# Patient Record
Sex: Male | Born: 2000 | Race: White | Hispanic: No | Marital: Single | Smoking: Never smoker
Health system: Southern US, Community
[De-identification: ages and names within clinical notes are randomized; demographics above are authoritative.]

## PROBLEM LIST (undated history)

## (undated) ENCOUNTER — Encounter

## (undated) ENCOUNTER — Ambulatory Visit

## (undated) DIAGNOSIS — K529 Noninfective gastroenteritis and colitis, unspecified: Secondary | ICD-10-CM

---
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
## 2021-02-09 ENCOUNTER — Other Ambulatory Visit: Payer: Self-pay

## 2021-02-09 ENCOUNTER — Ambulatory Visit
Admission: EM | Admit: 2021-02-09 | Discharge: 2021-02-09 | Disposition: A | Discharge status: Home / Self Care | Payer: Commercial Managed Care - PPO | Attending: Emergency Medicine | Admitting: Emergency Medicine

## 2021-02-09 ENCOUNTER — Encounter: Payer: Self-pay | Admitting: Emergency Medicine

## 2021-02-09 DIAGNOSIS — R0981 Nasal congestion: Secondary | ICD-10-CM

## 2021-02-09 DIAGNOSIS — J019 Acute sinusitis, unspecified: Secondary | ICD-10-CM

## 2021-02-09 MED ORDER — AMOXICILLIN-POT CLAVULANATE 875-125 MG PO TABS: 1.0000 | ORAL_TABLET | Freq: Two times a day (BID) | ORAL | 0 refills | Status: AC

## 2021-02-09 NOTE — Discharge Instructions (Signed)
COVID testing ordered.  It will take between 5-7 days for test results.  Someone will contact you regarding abnormal results.    In the meantime: You should remain isolated in your home for 5 days from symptom onset AND greater than 72 hours after symptoms resolution (absence of fever without the use of fever-reducing medication and improvement in respiratory symptoms), whichever is longer Get plenty of rest and push fluids Augmentin for sinus infection Use OTC zyrtec for nasal congestion, runny nose, and/or sore throat Use OTC flonase for nasal congestion and runny nose Use medications daily for symptom relief Use OTC medications like ibuprofen or tylenol as needed fever or pain Call or go to the ED if you have any new or worsening symptoms such as fever, cough, shortness of breath, chest tightness, chest pain, turning blue, changes in mental status, etc..Marland Kitchen

## 2021-02-09 NOTE — ED Triage Notes (Signed)
PT here with sinus pressure and nasal congestion x 3 weeks. C/O headache and sore throat as well. No relief from OTC meds.

## 2021-02-09 NOTE — ED Provider Notes (Signed)
[facility]   [id number] 02/09/21 Arrival Time: 1609   CC: COVID symptoms  SUBJECTIVE: History from: patient.  Lebert H Bidinger is a 20 y.o. male who presents with sinus pain, pressure, congestion, PND x 2-3 weeks.  Father with sinus infection one month ago.  Denies alleviating or aggravating factors.  Reports previous symptoms in the past.   Denies fever, chills, fatigue, sinus pain, rhinorrhea, sore throat, SOB, wheezing, chest pain, nausea, changes in bowel or bladder habits.     ROS: As per HPI.  All other pertinent ROS negative.     History reviewed. No pertinent past medical history. History reviewed. No pertinent surgical history. No Known Allergies No current facility-administered medications on file prior to encounter.   Current Outpatient Medications on File Prior to Encounter  Medication Sig Dispense Refill   albuterol (PROVENTIL HFA;VENTOLIN HFA) 108 (90 BASE) MCG/ACT inhaler Inhale 1-2 puffs into the lungs as needed for wheezing or shortness of breath (as needed before exercise). 2 Inhaler 2   Fexofenadine HCl (ALLEGRA PO) Take by mouth.     Social History   Socioeconomic History   Marital status: Single    Spouse name: Not on file   Number of children: Not on file   Years of education: Not on file   Highest education level: Not on file  Occupational History   Not on file  Tobacco Use   Smoking status: Never   Smokeless tobacco: Never  Substance and Sexual Activity   Alcohol use: Never   Drug use: Never   Sexual activity: Not on file  Other Topics Concern   Not on file  Social History Narrative   Not on file   Social Determinants of Health   Financial Resource Strain: Not on file  Food Insecurity: Not on file  Transportation Needs: Not on file  Physical Activity: Not on file  Stress: Not on file  Social Connections: Not on file  Intimate Partner Violence: Not on file   Family History  Problem Relation Age of Onset   Heart attack Father      OBJECTIVE:  Vitals:   02/09/21 1620 02/09/21 1621  BP: 126/77   Pulse: 85   Resp: 18   Temp: 98.4 F (36.9 C)   TempSrc: Oral   SpO2: 97%   Weight:  135 lb (61.2 kg)  Height:  5\' 7"  (1.702 m)    General appearance: alert; well-appearing, nontoxic; speaking in full sentences and tolerating own secretions HEENT: NCAT; Ears: EACs clear, TMs pearly gray; Eyes: PERRL.  EOM grossly intact.Nose: nares patent without rhinorrhea, Throat: oropharynx clear, tonsils non erythematous or enlarged, uvula midline  Neck: supple without LAD Lungs: unlabored respirations, symmetrical air entry; cough: absent; no respiratory distress; CTAB Heart: regular rate and rhythm.  Skin: warm and dry Psychological: alert and cooperative; normal mood and affect   ASSESSMENT & PLAN:  1. Sinus congestion   2. Acute non-recurrent sinusitis, unspecified location     Meds ordered this encounter  Medications   amoxicillin-clavulanate (AUGMENTIN) 875-125 MG tablet    Sig: Take 1 tablet by mouth every 12 (twelve) hours for 10 days.    Dispense:  20 tablet    Refill:  0    Order Specific Question:   Supervising Provider    Answer:   Eustace Moore    COVID testing ordered.  It will take between 5-7 days for test results.  Someone will contact you regarding abnormal results.    In  the meantime: You should remain isolated in your home for 5 days from symptom onset AND greater than 72 hours after symptoms resolution (absence of fever without the use of fever-reducing medication and improvement in respiratory symptoms), whichever is longer Get plenty of rest and push fluids Augmentin for sinus infection Use OTC zyrtec for nasal congestion, runny nose, and/or sore throat Use OTC flonase for nasal congestion and runny nose Use medications daily for symptom relief Use OTC medications like ibuprofen or tylenol as needed fever or pain Call or go to the ED if you have any new or worsening symptoms  such as fever, cough, shortness of breath, chest tightness, chest pain, turning blue, changes in mental status, etc...   Reviewed expectations re: course of current medical issues. Questions answered. Outlined signs and symptoms indicating need for more acute intervention. Patient verbalized understanding. After Visit Summary given.          Rennis Harding, PA-C 02/09/21 1642

## 2021-02-10 LAB — COVID-19, FLU A+B NAA
Influenza A, NAA: NOT DETECTED
Influenza B, NAA: NOT DETECTED
SARS-CoV-2, NAA: DETECTED — AB

## 2021-07-06 ENCOUNTER — Encounter (HOSPITAL_COMMUNITY): Payer: Self-pay | Admitting: *Deleted

## 2021-07-06 ENCOUNTER — Encounter: Payer: Self-pay | Admitting: Emergency Medicine

## 2021-07-06 ENCOUNTER — Emergency Department (HOSPITAL_COMMUNITY): Payer: Commercial Managed Care - PPO

## 2021-07-06 ENCOUNTER — Other Ambulatory Visit: Payer: Self-pay

## 2021-07-06 ENCOUNTER — Ambulatory Visit
Admission: EM | Admit: 2021-07-06 | Discharge: 2021-07-06 | Disposition: A | Discharge status: Home / Self Care | Payer: Commercial Managed Care - PPO | Attending: Emergency Medicine | Admitting: Emergency Medicine

## 2021-07-06 ENCOUNTER — Emergency Department (HOSPITAL_COMMUNITY)
Admission: EM | Admit: 2021-07-06 | Discharge: 2021-07-06 | Disposition: A | Discharge status: Home / Self Care | Payer: Commercial Managed Care - PPO | Attending: Emergency Medicine | Admitting: Emergency Medicine

## 2021-07-06 DIAGNOSIS — R197 Diarrhea, unspecified: Secondary | ICD-10-CM

## 2021-07-06 DIAGNOSIS — K529 Noninfective gastroenteritis and colitis, unspecified: Secondary | ICD-10-CM | POA: Insufficient documentation

## 2021-07-06 DIAGNOSIS — R1084 Generalized abdominal pain: Secondary | ICD-10-CM | POA: Diagnosis not present

## 2021-07-06 DIAGNOSIS — R109 Unspecified abdominal pain: Secondary | ICD-10-CM | POA: Diagnosis present

## 2021-07-06 LAB — URINALYSIS, ROUTINE W REFLEX MICROSCOPIC
Bilirubin Urine: NEGATIVE
Glucose, UA: NEGATIVE mg/dL
Hgb urine dipstick: NEGATIVE
Ketones, ur: NEGATIVE mg/dL
Leukocytes,Ua: NEGATIVE
Nitrite: NEGATIVE
Protein, ur: NEGATIVE mg/dL
Specific Gravity, Urine: 1.01 (ref 1.005–1.030)
pH: 6.5 (ref 5.0–8.0)

## 2021-07-06 LAB — CBC
HCT: 44.6 % (ref 39.0–52.0)
Hemoglobin: 15 g/dL (ref 13.0–17.0)
MCH: 31.7 pg (ref 26.0–34.0)
MCHC: 33.6 g/dL (ref 30.0–36.0)
MCV: 94.3 fL (ref 80.0–100.0)
Platelets: 179 10*3/uL (ref 150–400)
RBC: 4.73 MIL/uL (ref 4.22–5.81)
RDW: 13.7 % (ref 11.5–15.5)
WBC: 4.3 10*3/uL (ref 4.0–10.5)
nRBC: 0 % (ref 0.0–0.2)

## 2021-07-06 LAB — COMPREHENSIVE METABOLIC PANEL
ALT: 27 U/L (ref 0–44)
AST: 34 U/L (ref 15–41)
Albumin: 3.8 g/dL (ref 3.5–5.0)
Alkaline Phosphatase: 62 U/L (ref 38–126)
Anion gap: 8 (ref 5–15)
BUN: 14 mg/dL (ref 6–20)
CO2: 28 mmol/L (ref 22–32)
Calcium: 8.6 mg/dL — ABNORMAL LOW (ref 8.9–10.3)
Chloride: 102 mmol/L (ref 98–111)
Creatinine, Ser: 0.95 mg/dL (ref 0.61–1.24)
GFR, Estimated: >60 mL/min (ref >60)
Glucose, Bld: 153 mg/dL — ABNORMAL HIGH (ref 70–99)
Potassium: 3.8 mmol/L (ref 3.5–5.1)
Sodium: 138 mmol/L (ref 135–145)
Total Bilirubin: 0.2 mg/dL — ABNORMAL LOW (ref 0.3–1.2)
Total Protein: 7 g/dL (ref 6.5–8.1)

## 2021-07-06 LAB — LIPASE, BLOOD: Lipase: 35 U/L (ref 11–51)

## 2021-07-06 MED ORDER — IOHEXOL 300 MG/ML  SOLN
100.0000 mL | Freq: Once | INTRAMUSCULAR | Status: AC | PRN
  Administered 2021-07-06: 100 mL via INTRAVENOUS

## 2021-07-06 MED ORDER — METRONIDAZOLE 500 MG PO TABS: 500.0000 mg | ORAL_TABLET | Freq: Two times a day (BID) | ORAL | 0 refills | Status: AC

## 2021-07-06 MED ORDER — CIPROFLOXACIN HCL 500 MG PO TABS: 500.0000 mg | ORAL_TABLET | Freq: Two times a day (BID) | ORAL | 0 refills | Status: AC

## 2021-07-06 NOTE — ED Provider Notes (Signed)
HPI  SUBJECTIVE:  Matthew Salas is a 20 y.o. male who presents with 1 week of diffuse abdominal pain/discomfort present before stooling.  It lasts minutes until he uses the bathroom.  He states that he had normal stooling pattern, but it was looser than usual up until yesterday.  He reports over 12 episodes of always bloody diarrhea/bowel movements starting yesterday.  He describes it as burgundy/maroon.  He denies bright red blood per rectum, melena.  The abdominal pain has not changed.  No nausea, vomiting, fevers, abdominal distention, anorexia.  Reports lightheadedness this morning.  No chest pain, shortness of breath, dizziness, syncope.  No recent antibiotics, travel, raw or undercooked foods, questionable leftovers, contacts with similar symptoms.  He states that he "drinks a lot", 4-6 beers per day, states that he has been slowing down for the past 3 to 4 days.  He has never had a colonoscopy.  He has never had symptoms like this before.  No history of abdominal surgeries, anticoagulant/antiplatelet use.  PMD:Pcp, No   History reviewed. No pertinent past medical history.  History reviewed. No pertinent surgical history.  Family History  Problem Relation Age of Onset   Heart attack Father     Social History   Tobacco Use   Smoking status: Never   Smokeless tobacco: Never  Substance Use Topics   Alcohol use: Never   Drug use: Never    No current facility-administered medications for this encounter.  Current Outpatient Medications:    albuterol (PROVENTIL HFA;VENTOLIN HFA) 108 (90 BASE) MCG/ACT inhaler, Inhale 1-2 puffs into the lungs as needed for wheezing or shortness of breath (as needed before exercise)., Disp: 2 Inhaler, Rfl: 2   Fexofenadine HCl (ALLEGRA PO), Take by mouth., Disp: , Rfl:   No Known Allergies   ROS  As noted in HPI.   Physical Exam  BP 124/77 (BP Location: Right Arm)   Pulse 74   Temp 98.6 F (37 C) (Oral)   Resp 16   SpO2 99%    Constitutional: Well developed, well nourished, no acute distress Eyes:  EOMI, conjunctiva normal bilaterally HENT: Normocephalic, atraumatic,mucus membranes moist Respiratory: Normal inspiratory effort Cardiovascular: Normal rate GI: nondistended soft, nontender, nondistended.  Active bowel sounds.  No rebound, guarding Rectal: Deferred per patient request skin: No rash, skin intact Musculoskeletal: no deformities Neurologic: Alert & oriented x 3, no focal neuro deficits Psychiatric: Speech and behavior appropriate   ED Course   Medications - No data to display  No orders of the defined types were placed in this encounter.   No results found for this or any previous visit (from the past 24 hour(s)). No results found.  ED Clinical Impression  1. Bloody diarrhea   2. Generalized abdominal pain      ED Assessment/Plan  Patient reporting over 12 episodes of bloody, dark, diarrhea/stools starting yesterday.  Concern for lower GI bleed, severe infection, colitis.  Transferring to the emergency department for labs and further evaluation.  Patient is stable to go by private vehicle.  Discussed rationale for transfer to the emergency department with patient.  He agrees to go.  No orders of the defined types were placed in this encounter.   *This clinic note was created using Dragon dictation software. Therefore, there may be occasional mistakes despite careful proofreading.  ?    Domenick Gong, MD 07/06/21 1227

## 2021-07-06 NOTE — ED Provider Notes (Signed)
[facility] EMERGENCY DEPARTMENT Provider Note   CSN: [id number] Arrival date & time: 07/06/21  1304     History Chief Complaint  Patient presents with   Abdominal Pain    Matthew Salas is a 20 y.o. male.  The history is provided by the patient. No language interpreter was used.  Rectal Bleeding Quality:  Bright red Amount:  Moderate Duration:  2 days Timing:  Constant Chronicity:  New Context: not constipation and not hemorrhoids   Similar prior episodes: no   Relieved by:  Nothing Worsened by:  Nothing Ineffective treatments:  None tried Associated symptoms: abdominal pain   Risk factors: no anticoagulant use       History reviewed. No pertinent past medical history.  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 No pertinent surgical history.     Family History  Problem Relation Age of Onset   Heart attack Father     Social History   Tobacco Use   Smoking status: Never   Smokeless tobacco: Never  Substance Use Topics   Alcohol use: Never   Drug use: Never    Home Medications Prior to Admission medications   Medication Sig Start Date End Date Taking? Authorizing Provider  ciprofloxacin (CIPRO) 500 MG tablet Take 1 tablet (500 mg total) by mouth 2 (two) times daily for 10 days. 07/06/21 07/16/21 Yes Caryl Ada K, PA-C  metroNIDAZOLE (FLAGYL) 500 MG tablet Take 1 tablet (500 mg total) by mouth 2 (two) times daily for 10 days. 07/06/21 07/16/21 Yes Fransico Meadow, PA-C  albuterol (PROVENTIL HFA;VENTOLIN HFA) 108 (90 BASE) MCG/ACT inhaler Inhale 1-2 puffs into the lungs as needed for wheezing or shortness of breath (as needed before exercise). Patient not taking: Reported on 07/06/2021 10/21/13   Leeanne [place], MD   brompheniramine-pseudoephedrine-DM 30-2-10 MG/5ML syrup Take by mouth. Patient not taking: Reported on 07/06/2021 04/11/21   [provider]  Fexofenadine HCl (ALLEGRA PO) Take by mouth. Patient not taking: Reported on 07/06/2021    [provider]  lidocaine (XYLOCAINE) 2 % solution SMARTSIG:10 Milliliter(s) By Mouth Every 3 Hours PRN Patient not taking: Reported on 07/06/2021 04/11/21   [provider]    Allergies    Patient has no known allergies.  Review of Systems   Review of Systems  Gastrointestinal:  Positive for abdominal pain, blood in stool and hematochezia.  All other systems reviewed and are negative.  Physical Exam Updated Vital Signs BP 116/72 (BP Location: Left Arm)   Pulse 85   Temp 98.2 F (36.8 C) (Oral)   Resp 16   SpO2 100%   Physical Exam Vitals and nursing note reviewed.  Constitutional:      General: He is not in acute distress.    Appearance: He is well-developed.  HENT:     Head: Normocephalic and atraumatic.  Eyes:     Conjunctiva/sclera: Conjunctivae normal.  Cardiovascular:     Rate and Rhythm: Normal rate and regular rhythm.     Heart sounds: No murmur heard. Pulmonary:     Effort: Pulmonary effort is normal. No respiratory distress.     Breath sounds: Normal breath sounds.  Abdominal:     General: Abdomen is flat.     Palpations: Abdomen is  soft.     Tenderness: There is no abdominal tenderness.  Musculoskeletal:        General: No swelling.     Cervical back: Neck supple.  Skin:    General: Skin is warm and dry.     Capillary Refill: Capillary refill takes less than 2 seconds.  Neurological:     Mental Status: He is alert.  Psychiatric:        Mood and Affect: Mood normal.    ED Results / Procedures / Treatments   Labs (all labs ordered are listed, but only abnormal results are displayed) Labs Reviewed  COMPREHENSIVE METABOLIC PANEL - Abnormal; Notable for the following components:      Result Value    Glucose, Bld 153 (*)    Calcium 8.6 (*)    Total Bilirubin 0.2 (*)    All other components within normal limits  LIPASE, BLOOD  CBC  URINALYSIS, ROUTINE W REFLEX MICROSCOPIC    EKG None  Radiology CT ABDOMEN PELVIS W CONTRAST  Addendum Date: 07/06/2021   ADDENDUM REPORT: 07/06/2021 19:48 ADDENDUM: Dictation error involving the impression section of the report. The impression should state: Mild pancolonic and terminal ileal INFLAMMATION without evidence of perforation or walled off fluid collections. Findings are suggestive of infectious or inflammatory enterocolitis. Electronically Signed   By: Maudry Mayhew M.D.   On: 07/06/2021 19:48   Result Date: 07/06/2021 CLINICAL DATA:  One week of abdominal pain and diarrhea. EXAM: CT ABDOMEN AND PELVIS WITH CONTRAST TECHNIQUE: Multidetector CT imaging of the abdomen and pelvis was performed using the standard protocol following bolus administration of intravenous contrast. CONTRAST:  OMNIPAQUE IOHEXOL 300 MG/ML  SOLN COMPARISON:  None. FINDINGS: Lower chest: No acute abnormality. Hepatobiliary: No suspicious hepatic lesion. Gallbladder is decompressed. No biliary ductal dilation. Pancreas: No pancreatic ductal dilation or evidence of acute inflammation. Spleen: Within normal limits. Adrenals/Urinary Tract: Adrenal glands are unremarkable. Kidneys are normal, without renal calculi, focal lesion, or hydronephrosis. Bladder is unremarkable. Stomach/Bowel: No enteric contrast was administered. Stomach is unremarkable for degree of distension. No pathologic dilation of small or large bowel. Normal appendix. Inflammation of the terminal ileum. Mild pancolonic inflammation. No pneumatosis. Vascular/Lymphatic: No significant vascular findings are present. No pathologically enlarged abdominal or pelvic lymph nodes. Reproductive: Prostate is unremarkable. Other: No significant abdominopelvic free fluid. Musculoskeletal: No acute or significant osseous  findings. IMPRESSION: Mild pancolonic and terminal ileum trauma no evidence of perforation or walled off fluid collections. Findings are suggestive of infectious or inflammatory enterocolitis. Electronically Signed: By: Maudry Mayhew M.D. On: 07/06/2021 16:54    Procedures Procedures   Medications Ordered in ED Medications  iohexol (OMNIPAQUE) 300 MG/ML solution 100 mL (100 mLs Intravenous Contrast Given 07/06/21 1620)    ED Course  I have reviewed the triage vital signs and the nursing notes.  Pertinent labs & imaging results that were available during my care of the patient were reviewed by me and considered in my medical decision making (see chart for details).    MDM Rules/Calculators/A&P                           MDM:  Ct scan shows infectious/inflamatory changes.  Pt counseled on results. Pt given rx for cipro  Final Clinical Impression(s) / ED Diagnoses Final diagnoses:  Colitis    Rx / DC Orders ED Discharge Orders          Ordered    ciprofloxacin (CIPRO)  500 MG tablet  2 times daily        07/06/21 1933    metroNIDAZOLE (FLAGYL) 500 MG tablet  2 times daily        07/06/21 1933          An After Visit Summary was printed and given to the patient.    Fransico Meadow, PA-C 07/06/21 [place], [place], DO 07/09/21 [phone]

## 2021-07-06 NOTE — ED Notes (Signed)
Patient is being discharged from the Urgent Care and sent to the Emergency Department via POV . Per Konrad Saha, MD, patient is in need of higher level of care due to possible GI bleed. Patient is aware and verbalizes understanding of plan of care.  Vitals:   07/06/21 1141  BP: 124/77  Pulse: 74  Resp: 16  Temp: 98.6 F (37 C)  SpO2: 99%

## 2021-07-06 NOTE — Discharge Instructions (Addendum)
Return if any problems.  Schedule follow up with Gi.  See your Physician for recheck in 3-4 days

## 2021-07-06 NOTE — Discharge Instructions (Addendum)
I am concerned that you could have a lower GI bleed, colitis, or severe infection.  I Think we need more information to make sure that you are safe to go home.

## 2021-07-06 NOTE — ED Triage Notes (Signed)
Pt presents with abdominal pain and diarrhea xs 1 week. States the past two days has had bloody stools.

## 2021-07-06 NOTE — ED Triage Notes (Signed)
Abdominal pain with diarrhea for over a week, blood in stools for the past 2 days

## 2021-07-07 ENCOUNTER — Telehealth: Payer: Self-pay | Admitting: Internal Medicine

## 2021-07-07 NOTE — Telephone Encounter (Signed)
ER REFERRAL  

## 2021-08-01 ENCOUNTER — Encounter (HOSPITAL_COMMUNITY): Payer: Self-pay

## 2021-08-01 ENCOUNTER — Emergency Department (HOSPITAL_COMMUNITY): Payer: Commercial Managed Care - PPO

## 2021-08-01 ENCOUNTER — Emergency Department (HOSPITAL_COMMUNITY)
Admission: EM | Admit: 2021-08-01 | Discharge: 2021-08-02 | Disposition: A | Discharge status: Home / Self Care | Payer: Commercial Managed Care - PPO | Attending: Emergency Medicine | Admitting: Emergency Medicine

## 2021-08-01 ENCOUNTER — Other Ambulatory Visit: Payer: Self-pay

## 2021-08-01 DIAGNOSIS — R1032 Left lower quadrant pain: Secondary | ICD-10-CM | POA: Diagnosis not present

## 2021-08-01 DIAGNOSIS — R109 Unspecified abdominal pain: Secondary | ICD-10-CM | POA: Diagnosis present

## 2021-08-01 DIAGNOSIS — K529 Noninfective gastroenteritis and colitis, unspecified: Secondary | ICD-10-CM

## 2021-08-01 LAB — CBC
HCT: 44.9 % (ref 39.0–52.0)
Hemoglobin: 14.9 g/dL (ref 13.0–17.0)
MCH: 30.5 pg (ref 26.0–34.0)
MCHC: 33.2 g/dL (ref 30.0–36.0)
MCV: 92 fL (ref 80.0–100.0)
Platelets: 286 10*3/uL (ref 150–400)
RBC: 4.88 MIL/uL (ref 4.22–5.81)
RDW: 13.4 % (ref 11.5–15.5)
WBC: 7.9 10*3/uL (ref 4.0–10.5)
nRBC: 0 % (ref 0.0–0.2)

## 2021-08-01 LAB — COMPREHENSIVE METABOLIC PANEL
ALT: 17 U/L (ref 0–44)
AST: 18 U/L (ref 15–41)
Albumin: 3.1 g/dL — ABNORMAL LOW (ref 3.5–5.0)
Alkaline Phosphatase: 66 U/L (ref 38–126)
Anion gap: 8 (ref 5–15)
BUN: 5 mg/dL — ABNORMAL LOW (ref 6–20)
CO2: 27 mmol/L (ref 22–32)
Calcium: 8.7 mg/dL — ABNORMAL LOW (ref 8.9–10.3)
Chloride: 103 mmol/L (ref 98–111)
Creatinine, Ser: 0.98 mg/dL (ref 0.61–1.24)
GFR, Estimated: >60 mL/min (ref >60)
Glucose, Bld: 107 mg/dL — ABNORMAL HIGH (ref 70–99)
Potassium: 3.6 mmol/L (ref 3.5–5.1)
Sodium: 138 mmol/L (ref 135–145)
Total Bilirubin: 0.5 mg/dL (ref 0.3–1.2)
Total Protein: 6.7 g/dL (ref 6.5–8.1)

## 2021-08-01 LAB — LIPASE, BLOOD: Lipase: 25 U/L (ref 11–51)

## 2021-08-01 MED ORDER — OXYCODONE-ACETAMINOPHEN 5-325 MG PO TABS
1.0000 | ORAL_TABLET | Freq: Once | ORAL | Status: AC
  Administered 2021-08-01: 1 via ORAL
  Filled 2021-08-01: qty 1

## 2021-08-01 MED ORDER — IOHEXOL 300 MG/ML  SOLN
80.0000 mL | Freq: Once | INTRAMUSCULAR | Status: AC | PRN
  Administered 2021-08-01: 80 mL via INTRAVENOUS

## 2021-08-01 MED ORDER — ONDANSETRON 4 MG PO TBDP
4.0000 mg | ORAL_TABLET | Freq: Once | ORAL | Status: AC
  Administered 2021-08-01: 4 mg via ORAL
  Filled 2021-08-01: qty 1

## 2021-08-01 NOTE — ED Triage Notes (Signed)
Patient complains of ongoing abdominal pain after being seen at [facility] ED. Has taken 2 antibiotics and continuing to have pain more left sided. Nausea and frequent bowel movements all day long. Reports stool is loose and bloody.

## 2021-08-01 NOTE — ED Provider Notes (Signed)
Emergency Medicine Provider Triage Evaluation Note  Matthew Salas , a 21 y.o. male  was evaluated in triage.  Pt complains of left lower quadrant abdominal pain and bleeding.  Seen previously had a CT scan at [facility] that showed colitis, treated with antibiotics with no improvement.  Continues have severe pain and bloody stools.  Has a follow-up appointment with [facility] on the 19th.  Review of Systems  Positive: Hematochezia, abdominal pain Negative: Vomited  Physical Exam  BP 126/90 (BP Location: Left Arm)    Pulse 86    Temp 98 F (36.7 C) (Oral)    Resp 18    SpO2 100%  Gen:   Awake, no distress   Resp:  Normal effort  MSK:   Moves extremities without difficulty  Other:  TTP LLQ  Medical Decision Making  Medically screening exam initiated at 3:22 PM.  Appropriate orders placed.  Jairen H Barbone was informed that the remainder of the evaluation will be completed by another provider, this initial triage assessment does not replace that evaluation, and the importance of remaining in the ED until their evaluation is complete.  Abd pain - discussed possibility of Crohn's  Work up started   Margarita Mail, PA-C [phone] [phone]    [place] P, [place] [phone]

## 2021-08-02 ENCOUNTER — Telehealth: Payer: Self-pay | Admitting: *Deleted

## 2021-08-02 ENCOUNTER — Encounter: Payer: Self-pay | Admitting: Nurse Practitioner

## 2021-08-02 MED ORDER — OXYCODONE HCL 5 MG PO TABS: 2.5000 mg | ORAL_TABLET | Freq: Once | ORAL | Status: DC

## 2021-08-02 MED ORDER — OXYCODONE HCL 5 MG PO TABS: 2.5000 mg | ORAL_TABLET | Freq: Four times a day (QID) | ORAL | 0 refills | Status: AC | PRN

## 2021-08-02 MED ORDER — AMOXICILLIN-POT CLAVULANATE 875-125 MG PO TABS
ORAL_TABLET | ORAL | Status: AC
  Filled 2021-08-02: qty 1

## 2021-08-02 MED ORDER — OXYCODONE HCL 5 MG PO TABS
5.0000 mg | ORAL_TABLET | Freq: Once | ORAL | Status: AC
  Administered 2021-08-02: 5 mg via ORAL

## 2021-08-02 MED ORDER — AMOXICILLIN-POT CLAVULANATE 875-125 MG PO TABS
1.0000 | ORAL_TABLET | Freq: Once | ORAL | Status: AC
  Administered 2021-08-02: 1 via ORAL

## 2021-08-02 MED ORDER — AMOXICILLIN-POT CLAVULANATE 875-125 MG PO TABS: 1.0000 | ORAL_TABLET | Freq: Two times a day (BID) | ORAL | 0 refills | Status: AC

## 2021-08-02 MED ORDER — OXYCODONE HCL 5 MG PO TABS
ORAL_TABLET | ORAL | Status: AC
  Filled 2021-08-02: qty 1

## 2021-08-02 NOTE — Discharge Instructions (Addendum)
For pain control you may take at 1000 mg of Tylenol every 8 hours scheduled.  In addition you can take 0.5 to 1 tablet of Oxycodone every 6 hours as needed for pain not controlled with the scheduled Tylenol. ? ?

## 2021-08-02 NOTE — ED Provider Notes (Signed)
[facility] EMERGENCY DEPARTMENT Provider Note  CSN: [id number] Arrival date & time: 08/01/21 1407  Chief Complaint(s) No chief complaint on file.  HPI Matthew Salas is a 21 y.o. male recently treated for colitis with Cipro and Flagyl in early December here with persistent and gradually worsening abdominal pain.  Pain now more localized to the left abdomen described as cramping and burning sensation.  Patient is still having multiple bloody bowel movements throughout the day.  No fevers or chills.  No nausea or vomiting.  Patient has a follow-up appointment with gastroenterology on January 19.    HPI  Past Medical History History reviewed. No pertinent past medical history. 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 Authorizing Provider  albuterol (PROVENTIL HFA;VENTOLIN HFA) 108 (90 BASE) MCG/ACT inhaler Inhale 1-2 puffs into the lungs as needed for wheezing or shortness of breath (as needed before exercise). Patient not taking: Reported on 07/06/2021 10/21/13   Leeanne Rio, MD  brompheniramine-pseudoephedrine-DM 30-2-10 MG/5ML syrup Take by mouth. Patient not taking: Reported on 07/06/2021 04/11/21   [provider]  Fexofenadine HCl (ALLEGRA PO) Take by mouth. Patient not taking: Reported on 07/06/2021    [provider]  lidocaine (XYLOCAINE) 2 % solution SMARTSIG:10 Milliliter(s) By Mouth Every 3 Hours PRN Patient not taking: Reported on 07/06/2021 04/11/21   [provider]                                                                                                                                    Allergies Patient has no  known allergies.  Review of Systems Review of Systems As noted in HPI  Physical Exam Vital Signs  I have reviewed the triage vital signs BP 112/70    Pulse 76    Temp 98 F (36.7 C) (Oral)    Resp 18    SpO2 99%   Physical Exam Vitals reviewed.  Constitutional:      General: He is not in acute distress.    Appearance: He is well-developed. He is not diaphoretic.  HENT:     Head: Normocephalic and atraumatic.     Right Ear: External ear normal.     Left Ear: External ear normal.     Nose: Nose normal.     Mouth/Throat:     Mouth: Mucous membranes are moist.  Eyes:     General: No scleral icterus.    Conjunctiva/sclera: Conjunctivae normal.  Neck:     Trachea: Phonation normal.  Cardiovascular:     Rate and Rhythm: Normal rate and regular rhythm.  Pulmonary:  Effort: Pulmonary effort is normal. No respiratory distress.     Breath sounds: No stridor.  Abdominal:     General: There is no distension.     Tenderness: There is abdominal tenderness in the left lower quadrant. There is no guarding or rebound.  Musculoskeletal:        General: Normal range of motion.     Cervical back: Normal range of motion.  Neurological:     Mental Status: He is alert and oriented to person, place, and time.  Psychiatric:        Behavior: Behavior normal.    ED Results and Treatments Labs (all labs ordered are listed, but only abnormal results are displayed) Labs Reviewed  COMPREHENSIVE METABOLIC PANEL - Abnormal; Notable for the following components:      Result Value   Glucose, Bld 107 (*)    BUN 5 (*)    Calcium 8.7 (*)    Albumin 3.1 (*)    All other components within normal limits  GASTROINTESTINAL PANEL BY PCR, STOOL (REPLACES STOOL CULTURE)  C DIFFICILE QUICK SCREEN W PCR REFLEX    LIPASE, BLOOD  CBC  URINALYSIS, ROUTINE W REFLEX MICROSCOPIC                                                                                                                         EKG   EKG Interpretation  Date/Time:    Ventricular Rate:    PR Interval:    QRS Duration:   QT Interval:    QTC Calculation:   R Axis:     Text Interpretation:         Radiology CT ABDOMEN PELVIS W CONTRAST  Result Date: 08/01/2021 CLINICAL DATA:  Lower abdominal pain, bloody diarrhea. EXAM: CT ABDOMEN AND PELVIS WITH CONTRAST TECHNIQUE: Multidetector CT imaging of the abdomen and pelvis was performed using the standard protocol following bolus administration of intravenous contrast. CONTRAST:  64mL OMNIPAQUE IOHEXOL 300 MG/ML  SOLN COMPARISON:  July 06, 2021. FINDINGS: Lower chest: No acute abnormality. Hepatobiliary: No focal liver abnormality is seen. No gallstones, gallbladder wall thickening, or biliary dilatation. Pancreas: Unremarkable. No pancreatic ductal dilatation or surrounding inflammatory changes. Spleen: Normal in size without focal abnormality. Adrenals/Urinary Tract: Adrenal glands are unremarkable. Kidneys are normal, without renal calculi, focal lesion, or hydronephrosis. Bladder is unremarkable. Stomach/Bowel: The stomach and appendix are unremarkable. There is no evidence of bowel obstruction. Wall thickening of descending and sigmoid colon is noted concerning for infectious or inflammatory colitis. Vascular/Lymphatic: No significant vascular findings are present. No enlarged abdominal or pelvic lymph nodes. Reproductive: Prostate is unremarkable. Other: No abdominal wall hernia or abnormality. No abdominopelvic ascites. Musculoskeletal: No acute or significant osseous findings. IMPRESSION: Wall thickening of descending and sigmoid colon consistent with infectious or inflammatory colitis. Electronically Signed   By: Marijo Conception M.D.   On: 08/01/2021 16:25    Pertinent labs & imaging results that were available during my care of the patient were  reviewed by me and considered in my medical decision making (see MDM for details).  Medications Ordered in ED Medications   oxyCODONE-acetaminophen (PERCOCET/ROXICET) 5-325 MG per tablet 1 tablet (1 tablet Oral Given 08/01/21 1529)  ondansetron (ZOFRAN-ODT) disintegrating tablet 4 mg (4 mg Oral Given 08/01/21 1530)  iohexol (OMNIPAQUE) 300 MG/ML solution 80 mL (80 mLs Intravenous Contrast Given 08/01/21 1612)  amoxicillin-clavulanate (AUGMENTIN) 875-125 MG per tablet 1 tablet (1 tablet Oral Given 08/02/21 0417)  oxyCODONE (Oxy IR/ROXICODONE) immediate release tablet 5 mg (5 mg Oral Given 08/02/21 0417)                                                                                                                                     Procedures Procedures  (including critical care time)  Medical Decision Making / ED Course     Patient here with persistent abdominal pain and hematochezia. Previously treated with Cipro Flagyl for colitis.  Seen in the MSE process and appropriate labs and imaging obtained.  My interpretation of the labs and imaging noted below: CBC without leukocytosis or anemia. Metabolic panel without evidence of electrolyte derangements or renal sufficiency.  BUN within normal limits. CT scan does show some wall thickening in the descending and sigmoid colon.  I do not appreciate evidence of perforation.  This was confirmed by radiology.  The CT scan from early December showed pancolitis including the distal ileum.   Attempted to obtain stool sample during her stay but went to the bathroom prior to getting a collection.   Given the lack of significant metabolic derangements, obstruction, significant hemorrhage or peritonitis, the patient does not require admission to the hospital.  He is appropriate for outpatient management at this time.  Patient was treated symptomatically with oral pain medicine. Given the improvement of the patient's inflammation, will treat with additional antibiotics for likely infectious source. Patient will need to follow-up with gastroenterology to rule out IBD. We will  hold off on steroids at this time.   Patient and family requested information for local gastroenterologist.    Final Clinical Impression(s) / ED Diagnoses Final diagnoses:  None   The patient appears reasonably screened and/or stabilized for discharge and I doubt any other medical condition or other [facility] requiring further screening, evaluation, or treatment in the ED at this time prior to discharge. Safe for discharge with strict return precautions.  Disposition: Discharge  Condition: Good  I have discussed the results, Dx and Tx plan with the patient/family who expressed understanding and agree(s) with the plan. Discharge instructions discussed at length. The patient/family was given strict return precautions who verbalized understanding of the instructions. No further questions at time of discharge.    ED Discharge Orders          Ordered    amoxicillin-clavulanate (AUGMENTIN) 875-125 MG tablet  Every 12 hours        08/02/21 0439    oxyCODONE (ROXICODONE)  5 MG immediate release tablet  Every 6 hours PRN        08/02/21 [place] narcotic database reviewed and no active prescriptions noted.   Follow Up: [facility] [address] [place] [phone] Call  to schedule an appointment for close follow up  [facility] [address] [place] [phone] Call  to schedule an appointment for close follow up           This chart was dictated using voice recognition software.  Despite best efforts to proofread,  errors can occur which can change the documentation meaning.    Fatima Blank, MD 08/02/21 [phone]

## 2021-08-02 NOTE — Telephone Encounter (Signed)
Pharmacy called related to Rx: augmentin not being in stock; suggested to change to amoxicillin...EDCM clarified with EDP to change Rx to: amoxicillin.

## 2021-08-02 NOTE — ED Notes (Signed)
Pt went to the bathroom prior to being able to obtain samples for stool and urine

## 2021-08-17 ENCOUNTER — Ambulatory Visit: Payer: Commercial Managed Care - PPO | Admitting: Internal Medicine

## 2021-08-18 ENCOUNTER — Ambulatory Visit: Payer: Commercial Managed Care - PPO | Admitting: Nurse Practitioner

## 2022-01-17 MED ORDER — STELARA 90 MG/ML SUBCUTANEOUS SYRINGE
SUBCUTANEOUS | 2 refills | 56 days
Start: 2022-01-17 — End: ?

## 2022-02-08 DIAGNOSIS — K5100 Ulcerative (chronic) pancolitis without complications: Principal | ICD-10-CM

## 2022-02-09 NOTE — Unmapped (Signed)
[facility] Specialty Medication Onboarding    Specialty Medication: STELARA 90 mg/mL Syrg syringe (ustekinumab)  Prior Authorization: Not Required   Financial Assistance: No - patient must call for financial assistance per MAPs referral  Final Copay/Day Supply: $300 / 56    Insurance Restrictions: None     Notes to Pharmacist: Patient needs to apply for copay assistance, MAPs unable to apply on behalf of patient (text sent).    The triage team has completed the benefits investigation and has determined that the patient is able to fill this medication at [facility]. Please contact the patient to complete the onboarding or follow up with the prescribing physician as needed.

## 2022-02-12 NOTE — Unmapped (Addendum)
Stelara Q8W approved for $300/56ds copay. Contacted patient & LVM regarding co-pay card to bring down cost.   Patient outreach scheduled for 2 weeks before (~8/21)  first subcutaneous dose on 04/02/2022.  Per note to pharmacy on prescription & verified when called office on 7/17-   IV induction: 02/05/22  1st subcutaneous dose due: 04/02/22    *7/17: Called provider's office for them to provide TB results - should be faxing over     Mirian Capuchin PharmD  [facility]   [phone] opt 4

## 2022-02-13 NOTE — Unmapped (Signed)
7/18: Received TB results - in media tab. Quantiferon-TB Gold: negative

## 2022-04-05 NOTE — Unmapped (Unsigned)
***Incomplete onboarding - everything prepopulated - LVM - copay card applied: $5/56ds - now need to onboard     [facility]   Patient Onboarding/Medication Counseling    Danny Jones is a 21 y.o. male with Ulcerative pancolitis who I am counseling today on initiation of therapy.  I am speaking to the patient.    Was a Nurse, learning disability used for this call? No    Verified patient's date of birth / HIPAA.    Specialty medication(s) to be sent: Inflammatory Disorders: Stelara      Non-specialty medications/supplies to be sent: sharps      Medications not needed at this time: n/a         Stelara (ustekinumab)    Medication & Administration     Dosage: Ulcerative colitis: Inject 90mg  under the skin every 8 weeks starting 8 weeks after IV induction dose   IV induction dose: 02/05/2022   1st subcutaneous dose due: 04/02/2022    Lab tests required prior to treatment initiation:  Tuberculosis: Tuberculosis screening resulted in a non-reactive Quantiferon TB Gold assay.(Collected: 08/18/2021)    Administration:     Prefilled syringe  Gather all supplies needed for injection on a clean, flat working surface: medication syringe(s) removed from packaging, alcohol swab, sharps container, etc.  Look at the medication label - look for correct medication, correct dose, and check the expiration date  Look at the medication - the liquid in the syringe should appear clear and colorless to slightly yellow, you may see a few white particles  Lay the syringe on a flat surface and allow it to warm up to room temperature for at least 15-30 minutes  Select injection site - you can use the front of your thigh or your belly (but not the area 2 inches around your belly button); if someone else is giving you the injection you can also use your upper arm in the skin covering your triceps muscle or in the buttocks  Prepare injection site - wash your hands and clean the skin at the injection site with an alcohol swab and let it air dry, do not touch the injection site again before the injection  Pull off the needle safety cap, do not remove until immediately prior to injection  Pinch the skin - with your hand not holding the syringe pinch up a fold of skin at the injection site using your forefinger and thumb  Insert the needle into the fold of skin at about a 45 degree angle - it's best to use a quick dart-like motion  Push the plunger down slowly as far as it will go until the syringe is empty, if the plunger is not fully depressed the needle shield will not extend to cover the needle when it is removed, hold the syringe in place for a full 5 seconds  Check that the syringe is empty and keep pressing down on the plunger while you pull the needle out at the same angle as inserted; after the needle is removed completely from the skin, release the plunger allowing the needle shield to activate and cover the used needle  Dispose of the used syringe immediately in your sharps disposal container, do not attempt to recap the needle prior to disposing  If you see any blood at the injection site, press a cotton ball or gauze on the site and maintain pressure until the bleeding stops, do not rub the injection site      Adherence/Missed dose instructions:  If your injection is given more than 7-10 days after your scheduled injection date - consult your pharmacist for additional instructions on how to adjust your dosing schedule.    Goals of Therapy     Ulcerative colitis  Achieve remission of symptoms  Maintain remission of symptoms  Minimize long-term systemic glucocorticoid use  Prevent need for surgical procedures  Maintenance of effective psychosocial functioning      Side Effects & Monitoring Parameters     Injection site reaction (redness, irritation, inflammation localized to the site of administration)  Signs of a common cold - minor sore throat, runny or stuffy nose, etc.  Feeling tired or weak  Headache    The following side effects should be reported to the provider:  Signs of a hypersensitivity reaction - rash; hives; itching; red, swollen, blistered, or peeling skin; wheezing; tightness in the chest or throat; difficulty breathing, swallowing, or talking; swelling of the mouth, face, lips, tongue, or throat; etc.  Reduced immune function - report signs of infection such as fever; chills; body aches; very bad sore throat; ear or sinus pain; cough; more sputum or change in color of sputum; pain with passing urine; wound that will not heal, etc.  Also at a slightly higher risk of some malignancies (mainly skin and blood cancers) due to this reduced immune function.  In the case of signs of infection - the patient should hold the next dose of Stelara?? and call your primary care provider to ensure adequate medical care.  Treatment may be resumed when infection is treated and patient is asymptomatic.  Changes in skin - a new growth or lump that forms; changes in shape, size, or color of a previous mole or marking  Shortness of breath or chest pain  Vaginal itching or discharge      Contraindications, Warnings, & Precautions     Have your bloodwork checked as you have been told by your prescriber  Talk with your doctor if you are pregnant, planning to become pregnant, or breastfeeding  Discuss the possible need for holding your dose(s) of Stelara?? when a planned procedure is scheduled with the prescriber as it may delay healing/recovery timeline       Drug/Food Interactions     Medication list reviewed in Epic. The patient was instructed to inform the care team before taking any new medications or supplements. No drug interactions identified.   If you have a latex allergy use caution when handling, the needle cap of the Stelara?? prefilled syringe contains a derivative of natural rubber latex  Talk with you prescriber or pharmacist before receiving any live vaccinations while taking this medication and after you stop taking it    Storage, Handling you may store at room temperature for up to 30 days  Store in original packaging, protected from light  Do not shake  Dispose of used syringes/pens in a sharps disposal container          Current Medications (including OTC/herbals), Comorbidities and Allergies     Current Outpatient Medications   Medication Sig Dispense Refill    empty container Misc Use as directed to dispose of Stelara syringe 1 each 3    ustekinumab (STELARA) 90 mg/mL Syrg syringe Inject the contents of 1 syringe (90 mg total) under the skin every 8 weeks. 1 mL 2     No current facility-administered medications for this visit.       No Known Allergies    There is no problem list on  file for this patient.      Reviewed and up to date in Epic.    Appropriateness of Therapy     Acute infections noted within Epic:  No active infections  Patient reported infection: None    Is medication and dose appropriate based on diagnosis and infection status? Yes    Prescription has been clinically reviewed: Yes      Baseline Quality of Life Assessment      How many days over the past month did your ulcerative pancolitis  keep you from your normal activities? For example, brushing your teeth or getting up in the morning. 0    Financial Information     Medication Assistance provided: Prior Authorization and Copay Assistance    Anticipated copay of $5/56ds reviewed with patient. Verified delivery address.    Delivery Information     Scheduled delivery date: 04/10/22    Expected start date: 04/10/22    Medication will be delivered via UPS to the prescription address in [facility].  This shipment will not require a signature.      Explained the services we provide at [facility] and that each month we would call to set up refills.  Stressed importance of returning phone calls so that we could ensure they receive their medications in time each month.  Informed patient that we should be setting up refills 7-10 days prior to when they will run out of phone calls so that we could ensure they receive their medications in time each month.  Informed patient that we should be setting up refills 7-10 days prior to when they will run out of medication.  A pharmacist will reach out to perform a clinical assessment periodically.  Informed patient that a welcome packet, containing information about our pharmacy and other support services, a Notice of Privacy Practices, and a drug information handout will be sent.      The patient or caregiver noted above participated in the development of this care plan and knows that they can request review of or adjustments to the care plan at any time.      Patient or caregiver verbalized understanding of the above information as well as how to contact the pharmacy at [phone] option 4 with any questions/concerns.  The pharmacy is open Monday through Friday 8:30am-4:30pm.  A pharmacist is available 24/7 via pager to answer any clinical questions they may have.    Patient Specific Needs     Does the patient have any physical, cognitive, or cultural barriers? No    Does the patient have adequate living arrangements? (i.e. the ability to store and take their medication appropriately) {Blank single:19197::Yes,No - ***}    Did you identify any home environmental safety or security hazards? {Blank single:19197::No,Yes - ***}    Patient prefers to have medications discussed with  Patient     Is the patient or caregiver able to read and understand education materials at a high school level or above? Yes    Patient's primary language is  English     Is the patient high risk? No    SOCIAL DETERMINANTS OF HEALTH     At the [facility], we have learned that life circumstances - like trouble affording food, housing, utilities, or transportation can affect the health of many of our patients.   That is why we wanted to ask: are you currently experiencing any life circumstances that are negatively impacting your health and/or quality of life? {  YES/NO/PATIENTDECLINED:93004}    Social Determinants of Health     Financial Resource Strain: Not on file   Internet Connectivity: Not on file   Food Insecurity: Not on file   Tobacco Use: Not on file   Housing/Utilities: Not on file   Alcohol Use: Not on file   Transportation Needs: Not on file   Substance Use: Not on file   Health Literacy: Not on file   Physical Activity: Not on file   Interpersonal Safety: Not on file   Stress: Not on file   Intimate Partner Violence: Not on file   Depression: Not on file   Social Connections: Not on file       Would you be willing to receive help with any of the needs that you have identified today? {Yes/No/Not applicable:93005}       Teofilo Pod  [facility] Specialty Pharmacist

## 2022-04-06 MED ORDER — EMPTY CONTAINER
3 refills | 0 days
Start: 2022-04-06 — End: ?

## 2022-04-09 MED FILL — STELARA 90 MG/ML SUBCUTANEOUS SYRINGE: SUBCUTANEOUS | 56 days supply | Qty: 1 | Fill #0

## 2022-04-09 MED FILL — EMPTY CONTAINER: 120 days supply | Qty: 1 | Fill #0

## 2022-05-28 ENCOUNTER — Ambulatory Visit
Admission: EM | Admit: 2022-05-28 | Discharge: 2022-05-28 | Disposition: A | Discharge status: Home / Self Care | Payer: Commercial Managed Care - PPO | Attending: Nurse Practitioner | Admitting: Nurse Practitioner

## 2022-05-28 DIAGNOSIS — R110 Nausea: Secondary | ICD-10-CM | POA: Diagnosis not present

## 2022-05-28 DIAGNOSIS — J069 Acute upper respiratory infection, unspecified: Secondary | ICD-10-CM | POA: Insufficient documentation

## 2022-05-28 DIAGNOSIS — Z1152 Encounter for screening for COVID-19: Secondary | ICD-10-CM | POA: Diagnosis not present

## 2022-05-28 HISTORY — DX: Noninfective gastroenteritis and colitis, unspecified: K52.9

## 2022-05-28 LAB — RESP PANEL BY RT-PCR (FLU A&B, COVID) ARPGX2
Influenza A by PCR: NEGATIVE
Influenza B by PCR: NEGATIVE
SARS Coronavirus 2 by RT PCR: NEGATIVE

## 2022-05-28 MED ORDER — ONDANSETRON 4 MG PO TBDP: 4.0000 mg | ORAL_TABLET | Freq: Three times a day (TID) | ORAL | 0 refills | Status: DC | PRN

## 2022-05-28 NOTE — Unmapped (Addendum)
[facility] Clinical Assessment & Refill Coordination Note    Danny Jones reports noticeable improvement in his GI symptoms - no blood in the stools, less discomfort and GI issues since starting Stelara    Danny Jones, DOB: 12/17/2000  Phone: [phone] (home)     All above HIPAA information was verified with patient.     Was a Nurse, learning disability used for this call? No    Specialty Medication(s):   Inflammatory Disorders: Stelara     Current Outpatient Medications   Medication Sig Dispense Refill    empty container Misc Use as directed to dispose of Stelara syringe 1 each 3    ustekinumab (STELARA) 90 mg/mL Syrg syringe Inject the contents of 1 syringe (90 mg total) under the skin every 8 weeks. 1 mL 2     No current facility-administered medications for this visit.        Changes to medications: Danny Jones reports no changes at this time.    No Known Allergies    Changes to allergies: No    SPECIALTY MEDICATION ADHERENCE     Stelara 90 mg/ml: 0 days of medicine on hand   Medication Adherence    Patient reported X missed doses in the last month: 0  Specialty Medication: Stelara 90mg /mL - 1 q8w  Patient is on additional specialty medications: No  Patient is on more than two specialty medications: No  Any gaps in refill history greater than 2 weeks in the last 3 months: no  Demonstrates understanding of importance of adherence: yes  Informant: patient                            Specialty medication(s) dose(s) confirmed: Regimen is correct and unchanged.     Are there any concerns with adherence? No    Adherence counseling provided? Not needed    CLINICAL MANAGEMENT AND INTERVENTION      Clinical Benefit Assessment:    Do you feel the medicine is effective or helping your condition? Yes    Clinical Benefit counseling provided? Not needed    Adverse Effects Assessment:    Are you experiencing any side effects? No    Are you experiencing difficulty administering your medicine? No    Quality of Life Assessment:    Quality of Life    Rheumatology  Oncology  Dermatology  Cystic Fibrosis          How many days over the past month did your ulcerative pancolitis  keep you from your normal activities? For example, brushing your teeth or getting up in the morning. 0    Have you discussed this with your provider? Not needed    Acute Infection Status:    Acute infections noted within Epic:  No active infections  Patient reported infection: None    Therapy Appropriateness:    Is therapy appropriate and patient progressing towards therapeutic goals? Yes, therapy is appropriate and should be continued    DISEASE/MEDICATION-SPECIFIC INFORMATION      For patients on injectable medications: Patient currently has 0 doses left.  Next injection is scheduled for 06/05/2022.    Chronic Inflammatory Diseases: Have you experienced any flares in the last month? No  Has this been reported to your provider? N/A    PATIENT SPECIFIC NEEDS     Does the patient have any physical, cognitive, or cultural barriers? No    Is the patient high risk? No  Did the patient require a clinical intervention? No    Does the patient require physician intervention or other additional services (i.e., nutrition, smoking cessation, social work)? No    SOCIAL DETERMINANTS OF HEALTH     At the [facility], we have learned that life circumstances - like trouble affording food, housing, utilities, or transportation can affect the health of many of our patients.   That is why we wanted to ask: are you currently experiencing any life circumstances that are negatively impacting your health and/or quality of life? Patient declined to answer    Social Determinants of Health     Financial Resource Strain: Not on file   Internet Connectivity: Not on file   Food Insecurity: Not on file   Tobacco Use: Not on file   Housing/Utilities: Not on file   Alcohol Use: Not on file   Transportation Needs: Not on file   Substance Use: Not on file   Health Literacy: Not on file   Physical Activity: Not on file   Interpersonal Safety: Not on file   Stress: Not on file   Intimate Partner Violence: Not on file   Depression: Not on file   Social Connections: Not on file       Would you be willing to receive help with any of the needs that you have identified today? Not applicable       SHIPPING     Specialty Medication(s) to be Shipped:   Inflammatory Disorders: Stelara    Other medication(s) to be shipped: No additional medications requested for fill at this time     Changes to insurance: No    Delivery Scheduled: Yes, Expected medication delivery date: 05/31/2022.     Medication will be delivered via UPS to the confirmed prescription address in [facility].    The patient will receive a drug information handout for each medication shipped and additional FDA Medication Guides as required.  Verified that patient has previously received a Conservation officer, historic buildings and a Surveyor, mining.    The patient or caregiver noted above participated in the development of this care plan and knows that they can request review of or adjustments to the care plan at any time.      All of the patient's questions and concerns have been addressed.    Danny Jones, PharmD   [facility] Specialty Pharmacist

## 2022-05-28 NOTE — Discharge Instructions (Addendum)
You most likely have a viral upper respiratory infection.  This should improve over the next week to 10 days.  If your symptoms go on for longer than 10 days without improvement, please follow-up with [place] or your primary care provider.  If you develop chest pain or shortness of breath, go to the emergency room.  We have tested you today for COVID-19 and influenza.  You will see the results in Mychart and we will call you with positive results.    Please stay home and isolate until you are aware of the results.    Some things that can make you feel better are: - Increased rest - Increasing fluid with water/sugar free electrolytes - Acetaminophen and ibuprofen as needed for fever/pain - Salt water gargling, chloraseptic spray and throat lozenges - OTC guaifenesin (Mucinex) 600 mg twice daily for congestion - Saline sinus flushes or a neti pot for nasal congestion - Humidifying the air - Zofran as needed for nausea

## 2022-05-28 NOTE — ED Triage Notes (Signed)
Pt reports yesterday he had bad headache, body ache, chest congestion (hard to breath) Took ibuprofen but no relief. Very nauseas this morning.

## 2022-05-28 NOTE — ED Provider Notes (Signed)
[facility]    CSN: [id number] Arrival date & time: 05/28/22  1042      History   Chief Complaint No chief complaint on file.   HPI Matthew Salas is a 21 y.o. male.   Patient presents for 1 day of body aches, hot and cold chills, nasal congestion, headache, abdominal pain and nausea, decreased appetite, fatigue, and feeling like he cannot take a deep breath in.  No cough, chest pain, chest tightness, runny nose, sore throat, ear pain or drainage, vomiting, diarrhea, loss of taste or smell, or new rash.  No known sick contacts.  Taken at home COVID test yesterday that was negative.  Has not taken anything for symptoms so far.    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 No pertinent surgical history.     Home Medications    Prior to Admission medications   Medication Sig Start Date End Date Taking? Authorizing Provider  ondansetron (ZOFRAN-ODT) 4 MG disintegrating tablet Take 1 tablet (4 mg total) by mouth every 8 (eight) hours as needed for nausea or vomiting. 05/28/22  Yes Eulogio Bear, NP  albuterol (PROVENTIL HFA;VENTOLIN HFA) 108 (90 BASE) MCG/ACT inhaler Inhale 1-2 puffs into the lungs as needed for wheezing or shortness of breath (as needed before exercise). Patient not taking: Reported on 07/06/2021 10/21/13   Leeanne Rio, MD  Fexofenadine HCl (ALLEGRA PO) Take by mouth. Patient not taking: Reported on 07/06/2021    [provider]  lidocaine (XYLOCAINE) 2 % solution SMARTSIG:10 Milliliter(s) By Mouth Every 3 Hours PRN Patient not taking: Reported on 07/06/2021 04/11/21   [provider]    Family History Family History  Problem  Relation Age of Onset   Heart attack Father     Social History Social History   Tobacco Use   Smoking status: Never   Smokeless tobacco: Never  Substance Use Topics   Alcohol use: Never   Drug use: Never     Allergies   Patient has no known allergies.   Review of Systems Review of Systems Per HPI  Physical Exam Triage Vital Signs ED Triage Vitals  Enc Vitals Group     BP 05/28/22 1204 137/86     Pulse Rate 05/28/22 1204 63     Resp 05/28/22 1204 20     Temp 05/28/22 1204 98.4 F (36.9 C)     Temp Source 05/28/22 1204 Oral     SpO2 05/28/22 1204 98 %     Weight --      Height --      Head Circumference --      Peak Flow --      Pain Score 05/28/22 1208 4     Pain Loc --      Pain Edu? --      Excl. in [place]? --    No data found.  Updated Vital Signs BP 137/86 (BP Location: Right Arm)   Pulse 63   Temp 98.4 F (36.9 C) (Oral)   Resp 20   SpO2 98%   Visual Acuity Right Eye Distance:   Left Eye Distance:   Bilateral Distance:    Right Eye  Near:   Left Eye Near:    Bilateral Near:     Physical Exam Vitals and nursing note reviewed.  Constitutional:      General: He is not in acute distress.    Appearance: Normal appearance. He is not ill-appearing or toxic-appearing.  HENT:     Head: Normocephalic and atraumatic.     Right Ear: Tympanic membrane, ear canal and external ear normal.     Left Ear: Tympanic membrane, ear canal and external ear normal.     Nose: Congestion present. No rhinorrhea.     Mouth/Throat:     Mouth: Mucous membranes are moist.     Pharynx: Oropharynx is clear. No oropharyngeal exudate or posterior oropharyngeal erythema.  Eyes:     General: No scleral icterus.    Extraocular Movements: Extraocular movements intact.  Cardiovascular:     Rate and Rhythm: Normal rate and regular rhythm.  Pulmonary:     Effort: Pulmonary effort is normal. No respiratory distress.     Breath sounds: Normal breath sounds. No wheezing, rhonchi  or rales.  Abdominal:     General: Abdomen is flat. Bowel sounds are normal. There is no distension.     Palpations: Abdomen is soft.     Tenderness: There is abdominal tenderness. There is no right CVA tenderness, left CVA tenderness or guarding. Negative signs include Murphy's sign and McBurney's sign.       Comments: TTP in area marked  Musculoskeletal:     Cervical back: Normal range of motion and neck supple.  Lymphadenopathy:     Cervical: No cervical adenopathy.  Skin:    General: Skin is warm and dry.     Capillary Refill: Capillary refill takes less than 2 seconds.     Coloration: Skin is not jaundiced or pale.     Findings: No erythema or rash.  Neurological:     Mental Status: He is alert and oriented to person, place, and time.  Psychiatric:        Behavior: Behavior is cooperative.      UC Treatments / Results  Labs (all labs ordered are listed, but only abnormal results are displayed) Labs Reviewed  RESP PANEL BY RT-PCR (FLU A&B, COVID) [id number]    EKG   Radiology No results found.  Procedures Procedures (including critical care time)  Medications Ordered in UC Medications - No data to display  Initial Impression / Assessment and Plan / UC Course  I have reviewed the triage vital signs and the nursing notes.  Pertinent labs & imaging results that were available during my care of the patient were reviewed by me and considered in my medical decision making (see chart for details).   Patient is well-appearing, normotensive, afebrile, not tachycardic, not tachypneic, oxygenating well on room air.    Encounter for screening for COVID-19 Viral URI COVID-19, influenza testing obtained Suspect viral cause Supportive care discussed ER, return precautions discussed Note given for work  Nausea without vomiting Treat with Zofran 4 mg ODT every 8 hours as needed Patient declines Zofran while in urgent care today ER, return precautions discussed  The  patient was given the opportunity to ask questions.  All questions answered to their satisfaction.  The patient is in agreement to this plan.    Final Clinical Impressions(s) / UC Diagnoses   Final diagnoses:  Encounter for screening for COVID-19  Nausea without vomiting  Viral URI     Discharge Instructions      You most  likely have a viral upper respiratory infection.  This should improve over the next week to 10 days.  If your symptoms go on for longer than 10 days without improvement, please follow-up with [place] or your primary care provider.  If you develop chest pain or shortness of breath, go to the emergency room.  We have tested you today for COVID-19 and influenza.  You will see the results in Mychart and we will call you with positive results.    Please stay home and isolate until you are aware of the results.    Some things that can make you feel better are: - Increased rest - Increasing fluid with water/sugar free electrolytes - Acetaminophen and ibuprofen as needed for fever/pain - Salt water gargling, chloraseptic spray and throat lozenges - OTC guaifenesin (Mucinex) 600 mg twice daily for congestion - Saline sinus flushes or a neti pot for nasal congestion - Humidifying the air - Zofran as needed for nausea     ED Prescriptions     Medication Sig Dispense Auth. Provider   ondansetron (ZOFRAN-ODT) 4 MG disintegrating tablet Take 1 tablet (4 mg total) by mouth every 8 (eight) hours as needed for nausea or vomiting. 20 tablet Valentino Nose, NP      PDMP not reviewed this encounter.   Valentino Nose, NP 05/28/22 1306

## 2022-05-30 NOTE — Unmapped (Signed)
Danny Jones 's STELARA 90 mg/mL Syrg syringe (ustekinumab) shipment will be delayed as a result of a high copay.     I have reached out to the patient  at [phone] and communicated the delay. We will wait for a call back from the patient to reschedule the delivery.  We have not confirmed the new delivery date.     Note:  11/1-need copay card info (ID #)-waited on hold for 15 mins, no answer-pt will need to provide copay card info-sent [id number]

## 2022-06-04 NOTE — Unmapped (Signed)
Teryl Vertis Kelch 's STELARA 90 mg/mL Syrg syringe (ustekinumab) shipment will be canceled  as a result of a high copay.     I have reached out to the patient  at [phone] and communicated the delivery change. We will not reschedule the medication and have removed this/these medication(s) from the work request.  We have canceled this work request.     Note: 3 failed attempts (11/1, 11/2, 11/3) patient will need to provide correct copay card information

## 2022-06-19 MED FILL — STELARA 90 MG/ML SUBCUTANEOUS SYRINGE: SUBCUTANEOUS | 56 days supply | Qty: 1 | Fill #1

## 2022-06-19 NOTE — Unmapped (Signed)
[facility] Refill Coordination Note    Specialty Medication(s) to be Shipped:   Inflammatory Disorders: Stelara    Other medication(s) to be shipped: No additional medications requested for fill at this time     Benji Vertis Kelch, DOB: 2000/09/04  Phone: [phone] (home)       All above HIPAA information was verified with patient.     Was a Nurse, learning disability used for this call? No    Completed refill call assessment today to schedule patient's medication shipment from the [facility] [phone]).  All relevant notes have been reviewed.     Specialty medication(s) and dose(s) confirmed: Regimen is correct and unchanged.   Changes to medications: Simeon reports no changes at this time.  Changes to insurance: No  New side effects reported not previously addressed with a pharmacist or physician: None reported  Questions for the pharmacist: No    Confirmed patient received a Conservation officer, historic buildings and a Surveyor, mining with first shipment. The patient will receive a drug information handout for each medication shipped and additional FDA Medication Guides as required.       DISEASE/MEDICATION-SPECIFIC INFORMATION        For patients on injectable medications: Patient currently has 0 doses left.  Next injection is scheduled for dose was due 06/14/22.    SPECIALTY MEDICATION ADHERENCE     Medication Adherence    Patient reported X missed doses in the last month: 0  Specialty Medication: STELARA 90 mg/mL Syrg syringe (ustekinumab)  Patient is on additional specialty medications: No                                Were doses missed due to medication being on hold? No        REFERRAL TO PHARMACIST     Referral to the pharmacist: Not needed      [facility]     Shipping address confirmed in Epic.     Delivery Scheduled: Yes, Expected medication delivery date: 06/20/22.     Medication will be delivered via UPS to the prescription address in Epic WAM.    Quintella Reichert   [facility] Specialty Technician

## 2022-07-10 NOTE — Unmapped (Signed)
Error- Stelara is too soon to fill.

## 2022-08-08 DIAGNOSIS — K5100 Ulcerative (chronic) pancolitis without complications: Principal | ICD-10-CM

## 2022-08-08 NOTE — Unmapped (Signed)
[facility] Refill Coordination Note    Danny Jones, DOB: 2001-07-13  Phone: [phone] (home)       All above HIPAA information was verified with patient.         08/07/2022     8:30 PM   Specialty Rx Medication Refill Questionnaire   Which Medications would you like refilled and shipped? Stelara   Please list all current allergies: None   Have you missed any doses in the last 30 days? No   Have you had any changes to your medication(s) since your last refill? No   How many days remaining of each medication do you have at home? It???s every 8 weeks   Have you experienced any side effects in the last 30 days? No   Please enter the full address (street address, city, state, zip code) where you would like your medication(s) to be delivered to. [address] [place] [address]   Please specify on which day you would like your medication(s) to arrive. Note: if you need your medication(s) within 3 days, please call the pharmacy to schedule your order at [phone]  08/10/2022   Has your insurance changed since your last refill? Yes   If YES, please enter your new insurance information below. [id number]   Would you like a pharmacist to call you to discuss your medication(s)? No   Do you require a signature for your package? (Note: if we are billing Medicare Part B or your order contains a controlled substance, we will require a signature) No         Completed refill call assessment today to schedule patient's medication shipment from the [facility] [phone]).  All relevant notes have been reviewed.       Confirmed patient received a Conservation officer, historic buildings and a Surveyor, mining with first shipment. The patient will receive a drug information handout for each medication shipped and additional FDA Medication Guides as required.         REFERRAL TO PHARMACIST     Referral to the pharmacist: Not needed      [facility]     Shipping address confirmed in Epic. Delivery Scheduled: Yes, Expected medication delivery date: 08/10/22.     Medication will be delivered via UPS to the prescription address in Epic WAM.    Arnold Long, PharmD   [facility] Specialty Pharmacist

## 2022-08-09 MED FILL — STELARA 90 MG/ML SUBCUTANEOUS SYRINGE: SUBCUTANEOUS | 56 days supply | Qty: 1 | Fill #2

## 2022-09-17 MED ORDER — STELARA 90 MG/ML SUBCUTANEOUS SYRINGE
SUBCUTANEOUS | 2 refills | 56 days
Start: 2022-09-17 — End: ?

## 2022-09-18 MED ORDER — USTEKINUMAB 90 MG/ML SUBCUTANEOUS SYRINGE
SUBCUTANEOUS | 2 refills | 56 days
Start: 2022-09-18 — End: ?

## 2022-09-18 MED ORDER — STELARA 90 MG/ML SUBCUTANEOUS SYRINGE
SUBCUTANEOUS | 0 refills | 0 days
Start: 2022-09-18 — End: ?

## 2022-09-19 NOTE — Unmapped (Signed)
[facility] Refill Coordination Note    Specialty Medication(s) to be Shipped:   Inflammatory Disorders: Stelara    Other medication(s) to be shipped: No additional medications requested for fill at this time     Danny Jones, DOB: November 08, 2000  Phone: [phone] (home)       All above HIPAA information was verified with patient.     Was a Nurse, learning disability used for this call? No    Completed refill call assessment today to schedule patient's medication shipment from the [facility] [phone]).  All relevant notes have been reviewed.     Specialty medication(s) and dose(s) confirmed: Regimen is correct and unchanged.   Changes to medications: Danny Jones reports no changes at this time.  Changes to insurance: No  New side effects reported not previously addressed with a pharmacist or physician: None reported  Questions for the pharmacist: No    Confirmed patient received a Conservation officer, historic buildings and a Surveyor, mining with first shipment. The patient will receive a drug information handout for each medication shipped and additional FDA Medication Guides as required.       DISEASE/MEDICATION-SPECIFIC INFORMATION        For patients on injectable medications: Patient currently has 0 doses left.  Next injection is scheduled for 10/04/22.    SPECIALTY MEDICATION ADHERENCE     Medication Adherence    Patient reported X missed doses in the last month: 0  Specialty Medication: STELARA 90 mg/mL  Patient is on additional specialty medications: No              Were doses missed due to medication being on hold? No      REFERRAL TO PHARMACIST     Referral to the pharmacist: Not needed      [facility]     Shipping address confirmed in Epic.     Patient was notified of new phone menu : No    Delivery Scheduled: Yes, Expected medication delivery date: 10/04/22.     Medication will be delivered via UPS to the prescription address in Epic WAM.    Danny Jones   [facility] Specialty Technician

## 2022-10-03 MED FILL — STELARA 90 MG/ML SUBCUTANEOUS SYRINGE: SUBCUTANEOUS | 56 days supply | Qty: 1 | Fill #0

## 2022-11-05 MED ORDER — USTEKINUMAB 90 MG/ML SUBCUTANEOUS SYRINGE
SUBCUTANEOUS | 0 refills | 0 days
Start: 2022-11-05 — End: ?

## 2022-11-27 NOTE — Unmapped (Signed)
The [facility] has made a third and final attempt to reach this patient to refill the following medication:Stelara.      We have left voicemails on the following phone numbers: [phone], have sent a MyChart message, have sent a text message to the following phone numbers: [phone], and have sent a Mychart questionnaire..    Dates contacted: 4/16, 4/22, 11/27/22  Last scheduled delivery: 10/04/22    The patient may be at risk of non-compliance with this medication. The patient should call the [facility] at [phone]  Option 4, then Option 2: Dermatology, Gastroenterology, Rheumatology to refill medication.    Teofilo Pod, PharmD   [facility] Specialty Pharmacist

## 2022-12-01 ENCOUNTER — Ambulatory Visit
Admit: 2022-12-01 | Discharge: 2022-12-01 | Disposition: A | Discharge status: Home / Self Care | Payer: PRIVATE HEALTH INSURANCE

## 2022-12-01 ENCOUNTER — Emergency Department
Admit: 2022-12-01 | Discharge: 2022-12-01 | Disposition: A | Discharge status: Home / Self Care | Payer: PRIVATE HEALTH INSURANCE

## 2022-12-01 DIAGNOSIS — F1520 Other stimulant dependence, uncomplicated: Principal | ICD-10-CM

## 2022-12-01 DIAGNOSIS — R079 Chest pain, unspecified: Principal | ICD-10-CM

## 2022-12-01 LAB — COMPREHENSIVE METABOLIC PANEL
ALBUMIN: 3.8 g/dL (ref 3.5–5.0)
ALKALINE PHOSPHATASE: 84 U/L (ref 46–116)
ALT (SGPT): 23 U/L (ref 12–78)
ANION GAP: 14 mmol/L — ABNORMAL HIGH (ref 3–11)
AST (SGOT): 13 U/L — ABNORMAL LOW (ref 15–40)
BILIRUBIN TOTAL: 0.6 mg/dL (ref 0.3–1.2)
BLOOD UREA NITROGEN: 11 mg/dL (ref 8–20)
BUN / CREAT RATIO: 9
CALCIUM: 8.7 mg/dL (ref 8.5–10.1)
CHLORIDE: 101 mmol/L (ref 98–107)
CO2: 23.4 mmol/L (ref 21.0–32.0)
CREATININE: 1.2 mg/dL (ref 0.80–1.30)
EGFR CKD-EPI (2021) MALE: 88 mL/min/{1.73_m2} (ref >=60)
GLUCOSE RANDOM: 158 mg/dL (ref 70–179)
POTASSIUM: 3.2 mmol/L — ABNORMAL LOW (ref 3.5–5.0)
PROTEIN TOTAL: 7.2 g/dL (ref 6.0–8.0)
SODIUM: 138 mmol/L (ref 135–145)

## 2022-12-01 LAB — CBC W/ AUTO DIFF
BASOPHILS ABSOLUTE COUNT: 0.1 10*9/L (ref 0.0–0.2)
BASOPHILS RELATIVE PERCENT: 0.9 %
EOSINOPHILS ABSOLUTE COUNT: 0.9 10*9/L — ABNORMAL HIGH (ref 0.0–0.4)
EOSINOPHILS RELATIVE PERCENT: 8.8 %
HEMATOCRIT: 41.1 % (ref 36.0–50.0)
HEMOGLOBIN: 14.4 g/dL (ref 12.5–17.0)
LYMPHOCYTES ABSOLUTE COUNT: 2 10*9/L (ref 0.7–4.5)
LYMPHOCYTES RELATIVE PERCENT: 20 %
MEAN CORPUSCULAR HEMOGLOBIN CONC: 35 g/dL (ref 32.0–36.0)
MEAN CORPUSCULAR HEMOGLOBIN: 29.9 pg (ref 27.0–34.0)
MEAN CORPUSCULAR VOLUME: 85.3 fL (ref 80.0–98.0)
MEAN PLATELET VOLUME: 10.6 fL — ABNORMAL HIGH (ref 7.4–10.4)
MONOCYTES ABSOLUTE COUNT: 0.6 10*9/L (ref 0.1–1.0)
MONOCYTES RELATIVE PERCENT: 6.1 %
NEUTROPHILS ABSOLUTE COUNT: 6.2 10*9/L (ref 1.8–7.8)
NEUTROPHILS RELATIVE PERCENT: 64 %
PLATELET COUNT: 223 10*9/L (ref 140–415)
RED BLOOD CELL COUNT: 4.82 10*12/L (ref 4.10–5.60)
RED CELL DISTRIBUTION WIDTH: 12.7 % (ref 11.5–14.5)
WBC ADJUSTED: 9.8 10*9/L (ref 4.0–10.5)

## 2022-12-01 LAB — ETHANOL: ETHANOL: <3 mg/dL (ref <=10)

## 2022-12-01 LAB — DRUG SCREEN, URINE
AMPHETAMINE SCREEN URINE: POSITIVE — AB
BARBITURATE SCREEN URINE: NEGATIVE
BENZODIAZEPINE SCREEN, URINE: NEGATIVE
CANNABINOID SCREEN URINE: POSITIVE — AB
COCAINE(METAB.)SCREEN, URINE: NEGATIVE
METHADONE SCREEN, URINE: NEGATIVE
OPIATE SCREEN URINE: NEGATIVE
OXYCODONE SCREEN URINE: NEGATIVE

## 2022-12-01 LAB — HIGH SENSITIVITY TROPONIN I - SERIAL: HIGH SENSITIVITY TROPONIN I: <4 ng/L (ref <=53)

## 2022-12-01 LAB — D-DIMER, QUANTITATIVE: D-DIMER QUANTITATIVE (CW,ML,HL,HS,CH,JS,JC,RX,RH): 237 ng{FEU}/mL (ref <=500)

## 2022-12-01 LAB — HIGH SENSITIVITY TROPONIN I - 2 HOUR SERIAL
HIGH SENSITIVITY TROPONIN - DELTA (0-2H): 0 ng/L (ref <=7)
HIGH-SENSITIVITY TROPONIN I - 2 HOUR: <4 ng/L (ref <=53)

## 2022-12-01 LAB — TSH: THYROID STIMULATING HORMONE: 2.324 u[IU]/mL (ref 0.550–4.780)

## 2022-12-01 MED ADMIN — LORazepam (ATIVAN) injection 1 mg: 1 mg | INTRAVENOUS | @ 05:00:00 | Stop: 2022-12-01

## 2022-12-01 MED ADMIN — sodium chloride 0.9% (NS) bolus 1,000 mL: 1000 mL | INTRAVENOUS | @ 07:00:00 | Stop: 2022-12-01

## 2022-12-01 MED ADMIN — LORazepam (ATIVAN) tablet 1 mg: 1 mg | ORAL | @ 07:00:00 | Stop: 2022-12-01

## 2022-12-01 MED ADMIN — sodium chloride 0.9% (NS) bolus 1,000 mL: 1000 mL | INTRAVENOUS | @ 05:00:00 | Stop: 2022-12-01

## 2022-12-01 NOTE — Unmapped (Signed)
Chest pain times 40 minutes

## 2022-12-01 NOTE — Unmapped (Signed)
[facility]  Emergency Department Provider Note         History     Chief Complaint  Chest Pain (Chest pain times 40 minutes)      HPI   Danny Jones is a 22 y.o. male 22 year old male who presents with palpitation and fast heartbeat chest discomfort on the left chest.  Patient has taken total of 4 Adderall pills 20 mg each throughout the day.  He started taking the morning and then the last dose was at 10 PM.  Patient taking these Adderall which do not belong to him.  He also smokes Adderall as well.  Patient has chest discomfort or tightness or pressure along with palpitation.  He said he has had some palpitation during the middle of the day as well.  History of Crohn's/colitis.  He was on Stelara but stopped taking it to 3 months ago.  It was based on his own decision        No past medical history on file.    No past surgical history on file.    Prior to Admission medications    Medication Dose, Route, Frequency   empty container Misc Use as directed to dispose of Stelara syringe  Patient not taking: Reported on 12/01/2022   ustekinumab (STELARA) 90 mg/mL Syrg syringe Inject the contents of 1 syringe (90 mg total) under the skin every 8 weeks.  Patient not taking: Reported on 12/01/2022       Allergies  Patient has no known allergies.           Review of Systems   Constitutional: Negative.    HENT: Negative.     Eyes: Negative.    Respiratory: Negative.     Cardiovascular:  Positive for chest pain and palpitations.   Gastrointestinal: Negative.    Genitourinary: Negative.    Musculoskeletal: Negative.    Skin: Negative.    Psychiatric/Behavioral:  The patient is nervous/anxious.        As in HPI, all systems reviewed and otherwise negative.    Physical Exam      Vitals:    12/01/22 0023   BP: 137/93   Pulse: 132   Resp: 26   Temp: 36.4 ??C (97.6 ??F)   TempSrc: Oral   SpO2: 100%   Weight: 64 kg (141 lb 3.2 oz)         Physical Exam   Constitutional: Patient appears well-developed and well nourished. Non toxic in appearance.  HEENT: Unremarkable.  Head: Atraumatic.   Eyes: Normal ocular movements.  Neck: Supple with normal range of motion.   Pulmonary/Chest: Effort normal. No respiratory distress.  Abdominal: Soft and non tender abdomen.  Musculoskeletal: Extremities atraumatic.  Neurological: Alert with no focal neurological deficit. Ambulatory with a steady gait.  Skin: Warm and dry.  Psychiatry: Positive anxiety  Nursing note and vital signs reviewed.     ED Course     ED Course as of 12/01/22 0450   Sat Dec 01, 2022   0208 D-Dimer, Quantitative:    D-Dimer 237       Ativan 1 mg IV x 2 if no improvement with the first dose.    Medications ordered during this encounter   Medications    LORazepam (ATIVAN) injection 1 mg    sodium chloride 0.9% (NS) bolus 1,000 mL    LORazepam (ATIVAN) injection 1 mg        Medical Decision Making     I have reviewed the vital  signs and the nursing notes. Labs and radiology results that were available during my care of the patient were independently reviewed by me and considered in my medical decision making.   Smokes Adderall as well as takes Adderall which is not prescribed to him.  He uses other person's Adderall and smokes it after crushing it as well.  Advised not to do that.  He is advised not to take Adderall or smoking Adderall.        Medical Decision Making       Differential Diagnosis: Palpitations, arrhythmia, dysrhythmia, adverse drug effects.            Radiology     XR Chest 2 views    (Results Pending)       ED Clinical Impression     Final diagnoses:   None     Drug adverse effects secondary to use of Adderall    Procedures           This record has been created using [employer]. Chart creation errors have been sought, but may not always have been located. Such creation errors do not reflect on the standard of medical care.     America Brown, MD  12/01/22 [phone]

## 2023-02-11 NOTE — Unmapped (Signed)
Specialty Medication(s): Stelara    Danny Jones has been dis-enrolled from the [facility] specialty pharmacy services due to multiple unsuccessful outreach attempts by the pharmacy.    Additional information provided to the patient: n/a    Teofilo Pod, PharmD  [facility] Specialty Pharmacist

## 2023-08-16 ENCOUNTER — Other Ambulatory Visit: Payer: Self-pay

## 2023-08-16 ENCOUNTER — Inpatient Hospital Stay (HOSPITAL_BASED_OUTPATIENT_CLINIC_OR_DEPARTMENT_OTHER)
Admission: EM | Admit: 2023-08-16 | Discharge: 2023-08-20 | DRG: 387 | Disposition: A | Discharge status: Home / Self Care | Payer: BC Managed Care – PPO | Source: Ambulatory Visit | Attending: Internal Medicine | Admitting: Internal Medicine

## 2023-08-16 ENCOUNTER — Encounter (HOSPITAL_BASED_OUTPATIENT_CLINIC_OR_DEPARTMENT_OTHER): Payer: Self-pay

## 2023-08-16 ENCOUNTER — Emergency Department (HOSPITAL_BASED_OUTPATIENT_CLINIC_OR_DEPARTMENT_OTHER): Payer: BC Managed Care – PPO

## 2023-08-16 DIAGNOSIS — K51011 Ulcerative (chronic) pancolitis with rectal bleeding: Principal | ICD-10-CM | POA: Diagnosis present

## 2023-08-16 DIAGNOSIS — K5100 Ulcerative (chronic) pancolitis without complications: Secondary | ICD-10-CM | POA: Diagnosis present

## 2023-08-16 DIAGNOSIS — Z8249 Family history of ischemic heart disease and other diseases of the circulatory system: Secondary | ICD-10-CM

## 2023-08-16 DIAGNOSIS — K51019 Ulcerative (chronic) pancolitis with unspecified complications: Principal | ICD-10-CM

## 2023-08-16 DIAGNOSIS — J45909 Unspecified asthma, uncomplicated: Secondary | ICD-10-CM | POA: Diagnosis present

## 2023-08-16 DIAGNOSIS — F17210 Nicotine dependence, cigarettes, uncomplicated: Secondary | ICD-10-CM | POA: Diagnosis present

## 2023-08-16 DIAGNOSIS — K529 Noninfective gastroenteritis and colitis, unspecified: Secondary | ICD-10-CM | POA: Diagnosis present

## 2023-08-16 DIAGNOSIS — Z91128 Patient's intentional underdosing of medication regimen for other reason: Secondary | ICD-10-CM

## 2023-08-16 DIAGNOSIS — E860 Dehydration: Secondary | ICD-10-CM | POA: Diagnosis present

## 2023-08-16 DIAGNOSIS — Z79899 Other long term (current) drug therapy: Secondary | ICD-10-CM

## 2023-08-16 LAB — LIPASE, BLOOD: Lipase: <10 U/L — ABNORMAL LOW (ref 11–51)

## 2023-08-16 MED ORDER — ONDANSETRON HCL 4 MG/2ML IJ SOLN
4.0000 mg | Freq: Once | INTRAMUSCULAR | Status: AC
  Administered 2023-08-16: 4 mg via INTRAVENOUS
  Filled 2023-08-16: qty 2

## 2023-08-16 MED ORDER — MORPHINE SULFATE (PF) 4 MG/ML IV SOLN
4.0000 mg | Freq: Once | INTRAVENOUS | Status: AC
  Administered 2023-08-16: 4 mg via INTRAVENOUS
  Filled 2023-08-16: qty 1

## 2023-08-16 MED ORDER — SODIUM CHLORIDE 0.9 % IV BOLUS
1000.0000 mL | Freq: Once | INTRAVENOUS | Status: AC
  Administered 2023-08-16: 1000 mL via INTRAVENOUS

## 2023-08-16 MED ORDER — IOHEXOL 300 MG/ML  SOLN
100.0000 mL | Freq: Once | INTRAMUSCULAR | Status: AC | PRN
  Administered 2023-08-16: 100 mL via INTRAVENOUS

## 2023-08-16 MED ORDER — METHYLPREDNISOLONE SODIUM SUCC 125 MG IJ SOLR
125.0000 mg | Freq: Once | INTRAMUSCULAR | Status: AC
  Administered 2023-08-16: 125 mg via INTRAVENOUS
  Filled 2023-08-16: qty 2

## 2023-08-16 NOTE — ED Triage Notes (Signed)
Sent by PCP for symptoms of ulcerative colitis-diarrhea, abd pain, no appetite.

## 2023-08-16 NOTE — ED Provider Notes (Signed)
Matthew Salas   CSN: [id number] Arrival date & time: 08/16/23  1934     History  Chief Complaint  Patient presents with   Abdominal Pain    Matthew Salas is a 23 y.o. male.  Patient is a 23 year old male with past medical history of asthma and ulcerative colitis.  Patient initially diagnosed with the ulcerative colitis in 2023.  He was being treated with infusions, but discontinued these on his own.  For the past several weeks, he has had worsening abdominal cramping, diarrhea, and denies very little p.o. intake over the past 4 days.  He saw his gastroenterologist today and had blood work performed.  He was called and told that he was dehydrated and had an elevated white count and that he should come to the ER.  Patient does report having some bloody stools.  No fevers or chills.  No ill contacts.  He is not currently on any specific therapy for his ulcerative colitis.  The history is provided by the patient.       Home Medications Prior to Admission medications   Medication Sig Start Date End Date Taking? Authorizing Provider  albuterol (PROVENTIL HFA;VENTOLIN HFA) 108 (90 BASE) MCG/ACT inhaler Inhale 1-2 puffs into the lungs as needed for wheezing or shortness of breath (as needed before exercise). Patient not taking: Reported on 07/06/2021 10/21/13   Suzan Slick, MD  Fexofenadine HCl (ALLEGRA PO) Take by mouth. Patient not taking: Reported on 07/06/2021    [provider]  lidocaine (XYLOCAINE) 2 % solution SMARTSIG:10 Milliliter(s) By Mouth Every 3 Hours PRN Patient not taking: Reported on 07/06/2021 04/11/21   [provider]  ondansetron (ZOFRAN-ODT) 4 MG disintegrating tablet Take 1 tablet (4 mg total) by mouth every 8 (eight) hours as needed for nausea or vomiting. 05/28/22   Valentino Nose, NP      Allergies    Patient has no known allergies.    Review of Systems   Review of Systems  All  other systems reviewed and are negative.   Physical Exam Updated Vital Signs BP 121/74   Pulse (!) 110   Temp 98.5 F (36.9 C)   Resp 18   Ht 5\' 7"  (1.702 m)   Wt 61.2 kg   SpO2 97%   BMI 21.13 kg/m  Physical Exam Vitals and nursing Salas reviewed.  Constitutional:      General: He is not in acute distress.    Appearance: He is well-developed. He is not diaphoretic.  HENT:     Head: Normocephalic and atraumatic.  Cardiovascular:     Rate and Rhythm: Normal rate and regular rhythm.     Heart sounds: No murmur heard.    No friction rub.  Pulmonary:     Effort: Pulmonary effort is normal. No respiratory distress.     Breath sounds: Normal breath sounds. No wheezing or rales.  Abdominal:     General: Bowel sounds are normal. There is no distension.     Palpations: Abdomen is soft.     Tenderness: There is generalized abdominal tenderness. There is no right CVA tenderness, left CVA tenderness, guarding or rebound.  Musculoskeletal:        General: Normal range of motion.     Cervical back: Normal range of motion and neck supple.  Skin:    General: Skin is warm and dry.  Neurological:     Mental Status: He is alert and oriented to  person, place, and time.     Coordination: Coordination normal.     ED Results / Procedures / Treatments   Labs (all labs ordered are listed, but only abnormal results are displayed) Labs Reviewed  LIPASE, BLOOD - Abnormal; Notable for the following components:      Result Value   Lipase <10 (*)    All other components within normal limits    EKG None  Radiology CT ABDOMEN PELVIS W CONTRAST Result Date: 08/16/2023 CLINICAL DATA:  Abdominal pain EXAM: CT ABDOMEN AND PELVIS WITH CONTRAST TECHNIQUE: Multidetector CT imaging of the abdomen and pelvis was performed using the standard protocol following bolus administration of intravenous contrast. RADIATION DOSE REDUCTION: This exam was performed according to the departmental dose-optimization  program which includes automated exposure control, adjustment of the mA and/or kV according to patient size and/or use of iterative reconstruction technique. CONTRAST:  OMNIPAQUE IOHEXOL 300 MG/ML  SOLN COMPARISON:  08/01/2021 FINDINGS: Lower chest: No acute abnormality. Hepatobiliary: No focal hepatic abnormality. Gallbladder unremarkable. Pancreas: No focal abnormality or ductal dilatation. Spleen: No focal abnormality.  Normal size. Adrenals/Urinary Tract: No adrenal abnormality. No focal renal abnormality. No stones or hydronephrosis. Urinary bladder is unremarkable. Stomach/Bowel: Diffuse colonic wall thickening compatible with pancolitis. Normal appendix. Stomach and small bowel decompressed. Vascular/Lymphatic: No evidence of aneurysm or adenopathy. Reproductive: No visible focal abnormality. Other: No free fluid or free air. Musculoskeletal: No acute bony abnormality. IMPRESSION: Diffuse colonic wall thickening compatible with pancolitis. Electronically Signed   By: Charlett Nose M.D.   On: 08/16/2023 23:11    Procedures Procedures  {Document cardiac monitor, telemetry assessment procedure when appropriate:1}  Medications Ordered in ED Medications  sodium chloride 0.9 % bolus 1,000 mL (has no administration in time range)  ondansetron (ZOFRAN) injection 4 mg (has no administration in time range)  morphine (PF) 4 MG/ML injection 4 mg (has no administration in time range)  methylPREDNISolone sodium succinate (SOLU-MEDROL) 125 mg/2 mL injection 125 mg (has no administration in time range)  iohexol (OMNIPAQUE) 300 MG/ML solution 100 mL (100 mLs Intravenous Contrast Given 08/16/23 2231)    ED Course/ Medical Decision Making/ A&P   {   Click here for ABCD2, HEART and other calculatorsREFRESH Salas before signing :1}                              Medical Decision Making Risk Prescription drug management.   ***  {Document critical care time when appropriate:1} {Document review of labs  and clinical decision tools ie heart score, Chads2Vasc2 etc:1}  {Document your independent review of radiology images, and any outside records:1} {Document your discussion with family members, caretakers, and with consultants:1} {Document social determinants of health affecting pt's care:1} {Document your decision making why or why not admission, treatments were needed:1} Final Clinical Impression(s) / ED Diagnoses Final diagnoses:  None    Rx / DC Orders ED Discharge Orders     None

## 2023-08-17 ENCOUNTER — Encounter (HOSPITAL_COMMUNITY): Payer: Self-pay | Admitting: *Deleted

## 2023-08-17 DIAGNOSIS — Z91128 Patient's intentional underdosing of medication regimen for other reason: Secondary | ICD-10-CM | POA: Diagnosis not present

## 2023-08-17 DIAGNOSIS — Z8249 Family history of ischemic heart disease and other diseases of the circulatory system: Secondary | ICD-10-CM | POA: Diagnosis not present

## 2023-08-17 DIAGNOSIS — Z79899 Other long term (current) drug therapy: Secondary | ICD-10-CM | POA: Diagnosis not present

## 2023-08-17 DIAGNOSIS — F17210 Nicotine dependence, cigarettes, uncomplicated: Secondary | ICD-10-CM | POA: Diagnosis not present

## 2023-08-17 DIAGNOSIS — K529 Noninfective gastroenteritis and colitis, unspecified: Secondary | ICD-10-CM | POA: Diagnosis present

## 2023-08-17 DIAGNOSIS — E860 Dehydration: Secondary | ICD-10-CM | POA: Diagnosis not present

## 2023-08-17 DIAGNOSIS — K51011 Ulcerative (chronic) pancolitis with rectal bleeding: Secondary | ICD-10-CM | POA: Diagnosis not present

## 2023-08-17 DIAGNOSIS — K5100 Ulcerative (chronic) pancolitis without complications: Secondary | ICD-10-CM | POA: Diagnosis not present

## 2023-08-17 DIAGNOSIS — J45909 Unspecified asthma, uncomplicated: Secondary | ICD-10-CM | POA: Diagnosis not present

## 2023-08-17 LAB — CBC WITH DIFFERENTIAL/PLATELET
Abs Immature Granulocytes: 0.4 10*3/uL — ABNORMAL HIGH (ref 0.00–0.07)
Band Neutrophils: 2 %
Basophils Absolute: 0 10*3/uL (ref 0.0–0.1)
Basophils Relative: 0 %
Eosinophils Absolute: 0 10*3/uL (ref 0.0–0.5)
Eosinophils Relative: 0 %
HCT: 38.2 % — ABNORMAL LOW (ref 39.0–52.0)
Hemoglobin: 12.7 g/dL — ABNORMAL LOW (ref 13.0–17.0)
Lymphocytes Relative: 11 %
Lymphs Abs: 1.4 10*3/uL (ref 0.7–4.0)
MCH: 30.9 pg (ref 26.0–34.0)
MCHC: 33.2 g/dL (ref 30.0–36.0)
MCV: 92.9 fL (ref 80.0–100.0)
Metamyelocytes Relative: 1 %
Monocytes Absolute: 1.9 10*3/uL — ABNORMAL HIGH (ref 0.1–1.0)
Monocytes Relative: 15 %
Myelocytes: 2 %
Neutro Abs: 8.9 10*3/uL — ABNORMAL HIGH (ref 1.7–7.7)
Neutrophils Relative %: 69 %
Platelets: 379 10*3/uL (ref 150–400)
RBC: 4.11 MIL/uL — ABNORMAL LOW (ref 4.22–5.81)
RDW: 13.9 % (ref 11.5–15.5)
WBC: 12.5 10*3/uL — ABNORMAL HIGH (ref 4.0–10.5)
nRBC: 0 % (ref 0.0–0.2)

## 2023-08-17 LAB — COMPREHENSIVE METABOLIC PANEL
ALT: 8 U/L (ref 0–44)
AST: 10 U/L — ABNORMAL LOW (ref 15–41)
Albumin: 2.6 g/dL — ABNORMAL LOW (ref 3.5–5.0)
Alkaline Phosphatase: 54 U/L (ref 38–126)
Anion gap: 8 (ref 5–15)
BUN: 9 mg/dL (ref 6–20)
CO2: 29 mmol/L (ref 22–32)
Calcium: 8.4 mg/dL — ABNORMAL LOW (ref 8.9–10.3)
Chloride: 99 mmol/L (ref 98–111)
Creatinine, Ser: 0.83 mg/dL (ref 0.61–1.24)
GFR, Estimated: >60 mL/min (ref >60)
Glucose, Bld: 140 mg/dL — ABNORMAL HIGH (ref 70–99)
Potassium: 4.2 mmol/L (ref 3.5–5.1)
Sodium: 136 mmol/L (ref 135–145)
Total Bilirubin: 0.5 mg/dL (ref 0.0–1.2)
Total Protein: 6.1 g/dL — ABNORMAL LOW (ref 6.5–8.1)

## 2023-08-17 LAB — HIV ANTIBODY (ROUTINE TESTING W REFLEX): HIV Screen 4th Generation wRfx: NONREACTIVE

## 2023-08-17 MED ORDER — METHYLPREDNISOLONE SODIUM SUCC 125 MG IJ SOLR
90.0000 mg | Freq: Two times a day (BID) | INTRAMUSCULAR | Status: DC
  Administered 2023-08-17 – 2023-08-18 (×2): 90 mg via INTRAVENOUS
  Filled 2023-08-17 (×2): qty 2

## 2023-08-17 MED ORDER — MORPHINE SULFATE (PF) 4 MG/ML IV SOLN
4.0000 mg | Freq: Once | INTRAVENOUS | Status: AC
  Administered 2023-08-17: 4 mg via INTRAVENOUS
  Filled 2023-08-17: qty 1

## 2023-08-17 MED ORDER — MELATONIN 5 MG PO TABS
5.0000 mg | ORAL_TABLET | Freq: Every evening | ORAL | Status: AC | PRN
  Administered 2023-08-17 – 2023-08-18 (×2): 5 mg via ORAL
  Filled 2023-08-17 (×2): qty 1

## 2023-08-17 MED ORDER — OXYCODONE HCL 5 MG PO TABS
5.0000 mg | ORAL_TABLET | ORAL | Status: DC | PRN
  Administered 2023-08-17 – 2023-08-18 (×4): 5 mg via ORAL
  Filled 2023-08-17 (×4): qty 1

## 2023-08-17 MED ORDER — LACTATED RINGERS IV SOLN: INTRAVENOUS | Status: DC

## 2023-08-17 MED ORDER — MORPHINE SULFATE (PF) 2 MG/ML IV SOLN
2.0000 mg | INTRAVENOUS | Status: DC | PRN
  Administered 2023-08-18: 2 mg via INTRAVENOUS
  Filled 2023-08-17: qty 1

## 2023-08-17 MED ORDER — ACETAMINOPHEN 650 MG RE SUPP: 650.0000 mg | Freq: Four times a day (QID) | RECTAL | Status: DC | PRN

## 2023-08-17 MED ORDER — ACETAMINOPHEN 325 MG PO TABS
650.0000 mg | ORAL_TABLET | Freq: Four times a day (QID) | ORAL | Status: DC | PRN
  Administered 2023-08-20 (×2): 650 mg via ORAL
  Filled 2023-08-17 (×2): qty 2

## 2023-08-17 MED ORDER — ONDANSETRON HCL 4 MG PO TABS: 4.0000 mg | ORAL_TABLET | Freq: Four times a day (QID) | ORAL | Status: DC | PRN

## 2023-08-17 MED ORDER — LACTATED RINGERS IV BOLUS
500.0000 mL | Freq: Once | INTRAVENOUS | Status: AC
  Administered 2023-08-17: 500 mL via INTRAVENOUS

## 2023-08-17 MED ORDER — ONDANSETRON HCL 4 MG/2ML IJ SOLN
4.0000 mg | Freq: Four times a day (QID) | INTRAMUSCULAR | Status: DC | PRN
  Administered 2023-08-17 – 2023-08-19 (×3): 4 mg via INTRAVENOUS
  Filled 2023-08-17 (×3): qty 2

## 2023-08-17 MED ORDER — STELARA 90 MG/ML SUBCUTANEOUS SYRINGE
SUBCUTANEOUS | 5 refills | 56.00 days
Start: 2023-08-17 — End: ?

## 2023-08-17 NOTE — Progress Notes (Signed)
Patient requesting a med for sleep. Notified J. Garner Nash, NP. New order for melatonin and will give and continue to monitor.

## 2023-08-17 NOTE — H&P (Signed)
History and Physical  Matthew Salas ULA:[id number] DOB: June 25, 2001 DOA: 08/16/2023  PCP: [facility]   Chief Complaint: Abdominal pain  HPI: Matthew Salas is a 23 y.o. male with medical history significant for ulcerative colitis not currently on any medications admitted to the hospital with UC flare with pancolitis.  He was initially diagnosed in early 2023, he is followed by [facility] gastroenterology initially treated with [facility] with suboptimal response and was switched to Stelara.  He was doing very well, in March 2024 decided to stop his medication.  About a month ago, he started having recurrence of abdominal pain and diarrhea, reports 12-20 bouts of diarrhea per day, some tenesmus, some blood in the stool.  Reports abdominal pain starting in the left lower quadrant of the abdomen, and wrapping around.  Had some subjective fevers at home.  He was seen in the gastroenterology office yesterday, had some labs drawn and then was told to come to the ER.  ER provider has documented that lab work was reviewed, CMP unremarkable, WBC 21,000.  CT of the abdomen pelvis as detailed below with evidence of pancolitis.  He was started on empiric IV antibiotics, started on fluids, and admitted to the hospitalist service.  Currently patient states he is feeling a little bit better, diarrhea is starting to slow down, abdominal pain is better after receiving morphine.  Review of Systems: Please see HPI for pertinent positives and negatives. A complete 10 system review of systems are otherwise negative.  Past Medical History:  Diagnosis Date   Colitis    History reviewed. No pertinent surgical history. Social History:  reports that he has never smoked. He has never used smokeless tobacco. He reports that he does not drink alcohol and does not use drugs.  No Known Allergies  Family History  Problem Relation Age of Onset   Heart attack Father      Prior to Admission medications    Medication Sig Start Date End Date Taking? Authorizing Provider  albuterol (PROVENTIL HFA;VENTOLIN HFA) 108 (90 BASE) MCG/ACT inhaler Inhale 1-2 puffs into the lungs as needed for wheezing or shortness of breath (as needed before exercise). Patient not taking: Reported on 07/06/2021 10/21/13   Suzan Slick, MD  Fexofenadine HCl (ALLEGRA PO) Take by mouth. Patient not taking: Reported on 07/06/2021    [provider]  lidocaine (XYLOCAINE) 2 % solution SMARTSIG:10 Milliliter(s) By Mouth Every 3 Hours PRN Patient not taking: Reported on 07/06/2021 04/11/21   [provider]  ondansetron (ZOFRAN-ODT) 4 MG disintegrating tablet Take 1 tablet (4 mg total) by mouth every 8 (eight) hours as needed for nausea or vomiting. 05/28/22   Valentino Nose, NP    Physical Exam: BP 121/67 (BP Location: Right Arm)   Pulse 75   Temp 98.2 F (36.8 C) (Oral)   Resp 18   Ht 5\' 7"  (1.702 m)   Wt 61.2 kg   SpO2 100%   BMI 21.13 kg/m  General:  Alert, oriented, calm, in no acute distress  Eyes: EOMI, clear conjuctivae, white sclerea Neck: supple, no masses, trachea mildline  Cardiovascular: RRR, no murmurs or rubs, no peripheral edema  Respiratory: clear to auscultation bilaterally, no wheezes, no crackles  Abdomen: soft, tender with voluntary guarding, nondistended, normal bowel tones heard  Skin: dry, no rashes  Musculoskeletal: no joint effusions, normal range of motion  Psychiatric: appropriate affect, normal speech  Neurologic: extraocular muscles intact, clear speech, moving all extremities with intact sensorium  Labs on Admission:  Basic Metabolic Panel: No results for input(s): "NA", "K", "CL", "CO2", "GLUCOSE", "BUN", "CREATININE", "CALCIUM", "MG", "PHOS" in the last 168 hours. Liver Function Tests: No results for input(s): "AST", "ALT", "ALKPHOS", "BILITOT", "PROT", "ALBUMIN" in the last 168 hours. Recent Labs  Lab 08/16/23 1952  LIPASE <10*   No results for  input(s): "AMMONIA" in the last 168 hours. CBC: No results for input(s): "WBC", "NEUTROABS", "HGB", "HCT", "MCV", "PLT" in the last 168 hours. Cardiac Enzymes: No results for input(s): "CKTOTAL", "CKMB", "CKMBINDEX", "TROPONINI" in the last 168 hours. BNP (last 3 results) No results for input(s): "BNP" in the last 8760 hours.  ProBNP (last 3 results) No results for input(s): "PROBNP" in the last 8760 hours.  CBG: No results for input(s): "GLUCAP" in the last 168 hours.  Radiological Exams on Admission: CT ABDOMEN PELVIS W CONTRAST Result Date: 08/16/2023 CLINICAL DATA:  Abdominal pain EXAM: CT ABDOMEN AND PELVIS WITH CONTRAST TECHNIQUE: Multidetector CT imaging of the abdomen and pelvis was performed using the standard protocol following bolus administration of intravenous contrast. RADIATION DOSE REDUCTION: This exam was performed according to the departmental dose-optimization program which includes automated exposure control, adjustment of the mA and/or kV according to patient size and/or use of iterative reconstruction technique. CONTRAST:  OMNIPAQUE IOHEXOL 300 MG/ML  SOLN COMPARISON:  08/01/2021 FINDINGS: Lower chest: No acute abnormality. Hepatobiliary: No focal hepatic abnormality. Gallbladder unremarkable. Pancreas: No focal abnormality or ductal dilatation. Spleen: No focal abnormality.  Normal size. Adrenals/Urinary Tract: No adrenal abnormality. No focal renal abnormality. No stones or hydronephrosis. Urinary bladder is unremarkable. Stomach/Bowel: Diffuse colonic wall thickening compatible with pancolitis. Normal appendix. Stomach and small bowel decompressed. Vascular/Lymphatic: No evidence of aneurysm or adenopathy. Reproductive: No visible focal abnormality. Other: No free fluid or free air. Musculoskeletal: No acute bony abnormality. IMPRESSION: Diffuse colonic wall thickening compatible with pancolitis. Electronically Signed   By: Charlett Nose M.D.   On: 08/16/2023 23:11    Assessment/Plan Matthew Salas is a 23 y.o. male with medical history significant for ulcerative colitis not currently on any medications admitted to the hospital with UC flare with pancolitis.   Ulcerative colitis flare-with abdominal pain, subjective fever, copious bloody diarrhea.  CT scan with pancolitis and no other complication. -Observation admission -Liquid diet -Pain and nausea control as needed -Received 125 mg IV Solu-Medrol in the ER -Discussed with Dr. Ewing Schlein of [facility] who will consult -IV Solu-Medrol 60 mg every 8 hours -Continue LR infusion at 75 cc/h until diarrhea slows down  Leukocytosis-due to UC flare  DVT prophylaxis: Lovenox     Code Status: Full Code  Consults called: Gastroenterology Dr. Ewing Schlein  Admission status: Observation  Time spent: 17 minutes  Tala Eber Sharlette Dense MD [facility] Pager [phone]  If 7PM-7AM, please contact night-coverage [email or web address] Password [facility]  08/17/2023, 4:41 PM

## 2023-08-17 NOTE — ED Notes (Signed)
 [facility] called for transport

## 2023-08-18 ENCOUNTER — Encounter (HOSPITAL_COMMUNITY): Payer: Self-pay | Admitting: Internal Medicine

## 2023-08-18 DIAGNOSIS — E860 Dehydration: Secondary | ICD-10-CM | POA: Diagnosis present

## 2023-08-18 DIAGNOSIS — F17210 Nicotine dependence, cigarettes, uncomplicated: Secondary | ICD-10-CM | POA: Diagnosis present

## 2023-08-18 DIAGNOSIS — J45909 Unspecified asthma, uncomplicated: Secondary | ICD-10-CM | POA: Diagnosis present

## 2023-08-18 DIAGNOSIS — K5100 Ulcerative (chronic) pancolitis without complications: Secondary | ICD-10-CM | POA: Diagnosis not present

## 2023-08-18 DIAGNOSIS — K529 Noninfective gastroenteritis and colitis, unspecified: Secondary | ICD-10-CM | POA: Diagnosis present

## 2023-08-18 DIAGNOSIS — Z8249 Family history of ischemic heart disease and other diseases of the circulatory system: Secondary | ICD-10-CM | POA: Diagnosis not present

## 2023-08-18 DIAGNOSIS — K51011 Ulcerative (chronic) pancolitis with rectal bleeding: Secondary | ICD-10-CM | POA: Diagnosis present

## 2023-08-18 DIAGNOSIS — Z91128 Patient's intentional underdosing of medication regimen for other reason: Secondary | ICD-10-CM | POA: Diagnosis not present

## 2023-08-18 DIAGNOSIS — Z79899 Other long term (current) drug therapy: Secondary | ICD-10-CM | POA: Diagnosis not present

## 2023-08-18 LAB — CBC
HCT: 33.3 % — ABNORMAL LOW (ref 39.0–52.0)
Hemoglobin: 11.2 g/dL — ABNORMAL LOW (ref 13.0–17.0)
MCH: 31.1 pg (ref 26.0–34.0)
MCHC: 33.6 g/dL (ref 30.0–36.0)
MCV: 92.5 fL (ref 80.0–100.0)
Platelets: 325 10*3/uL (ref 150–400)
RBC: 3.6 MIL/uL — ABNORMAL LOW (ref 4.22–5.81)
RDW: 13.8 % (ref 11.5–15.5)
WBC: 10 10*3/uL (ref 4.0–10.5)
nRBC: 0 % (ref 0.0–0.2)

## 2023-08-18 LAB — COMPREHENSIVE METABOLIC PANEL
ALT: 10 U/L (ref 0–44)
AST: 10 U/L — ABNORMAL LOW (ref 15–41)
Albumin: 2.2 g/dL — ABNORMAL LOW (ref 3.5–5.0)
Alkaline Phosphatase: 42 U/L (ref 38–126)
Anion gap: 7 (ref 5–15)
BUN: 9 mg/dL (ref 6–20)
CO2: 25 mmol/L (ref 22–32)
Calcium: 7.8 mg/dL — ABNORMAL LOW (ref 8.9–10.3)
Chloride: 100 mmol/L (ref 98–111)
Creatinine, Ser: 0.76 mg/dL (ref 0.61–1.24)
GFR, Estimated: >60 mL/min (ref >60)
Glucose, Bld: 153 mg/dL — ABNORMAL HIGH (ref 70–99)
Potassium: 4 mmol/L (ref 3.5–5.1)
Sodium: 132 mmol/L — ABNORMAL LOW (ref 135–145)
Total Bilirubin: 0.3 mg/dL (ref 0.0–1.2)
Total Protein: 5.4 g/dL — ABNORMAL LOW (ref 6.5–8.1)

## 2023-08-18 LAB — C DIFFICILE QUICK SCREEN W PCR REFLEX
C Diff antigen: NEGATIVE
C Diff interpretation: NOT DETECTED
C Diff toxin: NEGATIVE

## 2023-08-18 MED ORDER — METHYLPREDNISOLONE SODIUM SUCC 125 MG IJ SOLR
90.0000 mg | Freq: Two times a day (BID) | INTRAMUSCULAR | Status: AC
  Administered 2023-08-18: 90 mg via INTRAVENOUS
  Filled 2023-08-18: qty 2

## 2023-08-18 MED ORDER — OXYCODONE HCL 5 MG PO TABS
5.0000 mg | ORAL_TABLET | ORAL | Status: DC | PRN
  Administered 2023-08-18 – 2023-08-20 (×4): 5 mg via ORAL
  Filled 2023-08-18 (×4): qty 1

## 2023-08-18 MED ORDER — IPRATROPIUM-ALBUTEROL 0.5-2.5 (3) MG/3ML IN SOLN: 3.0000 mL | RESPIRATORY_TRACT | Status: DC | PRN

## 2023-08-18 MED ORDER — TRAMADOL HCL 50 MG PO TABS
50.0000 mg | ORAL_TABLET | Freq: Four times a day (QID) | ORAL | Status: DC | PRN
  Administered 2023-08-18 – 2023-08-19 (×2): 50 mg via ORAL
  Filled 2023-08-18 (×2): qty 1

## 2023-08-18 MED ORDER — SENNOSIDES-DOCUSATE SODIUM 8.6-50 MG PO TABS: 1.0000 | ORAL_TABLET | Freq: Every evening | ORAL | Status: DC | PRN

## 2023-08-18 MED ORDER — PREDNISONE 20 MG PO TABS
40.0000 mg | ORAL_TABLET | Freq: Every day | ORAL | Status: DC
  Administered 2023-08-19 – 2023-08-20 (×2): 40 mg via ORAL
  Filled 2023-08-18 (×2): qty 2

## 2023-08-18 MED ORDER — LACTATED RINGERS IV SOLN: INTRAVENOUS | Status: AC

## 2023-08-18 MED ORDER — HYDRALAZINE HCL 20 MG/ML IJ SOLN
10.0000 mg | INTRAMUSCULAR | Status: DC | PRN
Start: 2023-08-18 — End: 2023-08-20

## 2023-08-18 MED ORDER — ORAL CARE MOUTH RINSE: 15.0000 mL | OROMUCOSAL | Status: DC | PRN

## 2023-08-18 MED ORDER — MORPHINE SULFATE (PF) 2 MG/ML IV SOLN
1.0000 mg | INTRAVENOUS | Status: DC | PRN
  Administered 2023-08-18 – 2023-08-19 (×2): 1 mg via INTRAVENOUS
  Filled 2023-08-18 (×2): qty 1

## 2023-08-18 MED ORDER — METOPROLOL TARTRATE 5 MG/5ML IV SOLN: 5.0000 mg | INTRAVENOUS | Status: DC | PRN

## 2023-08-18 MED ORDER — GUAIFENESIN 100 MG/5ML PO LIQD: 5.0000 mL | ORAL | Status: DC | PRN

## 2023-08-18 NOTE — Plan of Care (Signed)

## 2023-08-18 NOTE — Hospital Course (Addendum)
Brief Narrative:   23 year old with history of ulcerative colitis not on any medications at home admitted to the hospital with ulcerative colitis flare/pancolitis.  Patient was diagnosed in 2023 and followed at [facility] initially treated with Entyvio with suboptimal response and was switched to Stelara.  Since he was doing well he stopped taking his medication back in March.  About a month ago started having recurrent abdominal pain with significant episodes of frequent diarrhea.  In the ER noted to have pancolitis with leukocytosis. Upon admission seen by GI, started on steroids.  C. difficile test was negative.  Assessment & Plan:  Principal Problem:   Pancolitis (HCC)   Ulcerative colitis, pancolitis - Initially diagnosed in 2023 treated by [facility] GI team with Entyvio and later switched to Stelara due to suboptimal response but patient stopped taking it about 10 months ago.  Now having significant flareup which has worsened over the last month. - CT reviewed.  Showing uncomplicated pancolitis - Continue IV steroids, diet as tolerated, pain control, antiemetics, IV fluids - [facility] is consulted. -C diff = neg   DVT prophylaxis: SCDs Start: 08/17/23 1640    Code Status: Full Code Family Communication:  father updated.  Continue hospital stay  Subjective: Abd pain is better.   Examination: General exam: Appears calm and comfortable  Respiratory system: Clear to auscultation. Respiratory effort normal. Cardiovascular system: S1 & S2 heard, RRR. No JVD, murmurs, rubs, gallops or clicks. No pedal edema. Gastrointestinal system: Abdomen is nondistended, soft and nontender. No organomegaly or masses felt. Normal bowel sounds heard. Central nervous system: Alert and oriented. No focal neurological deficits. Extremities: Symmetric 5 x 5 power. Skin: No rashes, lesions or ulcers Psychiatry: Judgement and insight appear normal. Mood & affect appropriate.

## 2023-08-18 NOTE — Consult Note (Addendum)
Reason for Consult: Ulcerative colitis Referring Physician: Hospital team  Matthew Salas is an 23 y.o. male.  HPI: Patient seen and examined and his case discussed with his mother as well and interesting his brother has ulcerative colitis but he is well-maintained on a 5-ASA and the patient was diagnosed about 2 years ago and was initially on mesalamine and steroids including budesonide but was changed to [facility] and had been doing well and was asymptomatic in March when he quit it in about a week ago he had recurrence of his symptoms and did have 1 old dose of subcu Stelara which he gave himself on Monday when his symptoms continued he presented to the ER we are consulted for further workup and plans right now he is doing a little better on steroids but is still afraid to eat for having increased bowel movements and we had a long talk about the chronicity of his ulcerative colitis and the need for follow-up and the need to take his medicine and he has an appointment coming appointment with his primary gastroenterologist at [facility]  Past Medical History:  Diagnosis Date   Colitis     History reviewed. No pertinent surgical history.  Family History  Problem Relation Age of Onset   Heart attack Father     Social History:  reports that he has been smoking cigarettes. He has never used smokeless tobacco. He reports that he does not currently use alcohol after a past usage of about 2.0 standard drinks of alcohol per week. He reports that he does not use drugs.  Allergies: No Known Allergies  Medications: I have reviewed the patient's current medications.  Results for orders placed or performed during the hospital encounter of 08/16/23 (from the past 48 hours)  Lipase, blood     Status: Abnormal   Collection Time: 08/16/23  7:52 PM  Result Value Ref Range   Lipase <10 (L) 11 - 51 U/L    Comment: Performed at [facility], [address] [place], [place] [address]  HIV  Antibody (routine testing w rflx)     Status: None   Collection Time: 08/17/23  5:06 PM  Result Value Ref Range   HIV Screen 4th Generation wRfx Non Reactive Non Reactive    Comment: Performed at [facility], [address], [place], [place] [address]  CBC with Differential/Platelet     Status: Abnormal   Collection Time: 08/17/23  5:06 PM  Result Value Ref Range   WBC 12.5 (H) 4.0 - 10.5 K/uL   RBC 4.11 (L) 4.22 - 5.81 MIL/uL   Hemoglobin 12.7 (L) 13.0 - 17.0 g/dL   HCT 82.9 (L) 56.2 - 13.0 %   MCV 92.9 80.0 - 100.0 fL   MCH 30.9 26.0 - 34.0 pg   MCHC 33.2 30.0 - 36.0 g/dL   RDW 86.5 78.4 - 69.6 %   Platelets 379 150 - 400 K/uL   nRBC 0.0 0.0 - 0.2 %   Neutrophils Relative % 69 %   Neutro Abs 8.9 (H) 1.7 - 7.7 K/uL   Band Neutrophils 2 %   Lymphocytes Relative 11 %   Lymphs Abs 1.4 0.7 - 4.0 K/uL   Monocytes Relative 15 %   Monocytes Absolute 1.9 (H) 0.1 - 1.0 K/uL   Eosinophils Relative 0 %   Eosinophils Absolute 0.0 0.0 - 0.5 K/uL   Basophils Relative 0 %   Basophils Absolute 0.0 0.0 - 0.1 K/uL   Metamyelocytes Relative 1 %  Myelocytes 2 %   Abs Immature Granulocytes 0.40 (H) 0.00 - 0.07 K/uL   Polychromasia PRESENT     Comment: Performed at [facility], [address], [place], [place] [address]  Comprehensive metabolic panel     Status: Abnormal   Collection Time: 08/17/23  5:06 PM  Result Value Ref Range   Sodium 136 135 - 145 mmol/L   Potassium 4.2 3.5 - 5.1 mmol/L   Chloride 99 98 - 111 mmol/L   CO2 29 22 - 32 mmol/L   Glucose, Bld 140 (H) 70 - 99 mg/dL    Comment: Glucose reference range applies only to samples taken after fasting for at least 8 hours.   BUN 9 6 - 20 mg/dL   Creatinine, Ser 2.53 0.61 - 1.24 mg/dL   Calcium 8.4 (L) 8.9 - 10.3 mg/dL   Total Protein 6.1 (L) 6.5 - 8.1 g/dL   Albumin 2.6 (L) 3.5 - 5.0 g/dL   AST 10 (L) 15 - 41 U/L   ALT 8 0 - 44 U/L   Alkaline Phosphatase 54 38 - 126 U/L   Total Bilirubin 0.5 0.0 -  1.2 mg/dL   GFR, Estimated >66 >44 mL/min    Comment: (NOTE) Calculated using the CKD-EPI Creatinine Equation (2021)    Anion gap 8 5 - 15    Comment: Performed at [facility], [address] [place], [place] [address]  Comprehensive metabolic panel     Status: Abnormal   Collection Time: 08/18/23  6:55 AM  Result Value Ref Range   Sodium 132 (L) 135 - 145 mmol/L   Potassium 4.0 3.5 - 5.1 mmol/L   Chloride 100 98 - 111 mmol/L   CO2 25 22 - 32 mmol/L   Glucose, Bld 153 (H) 70 - 99 mg/dL    Comment: Glucose reference range applies only to samples taken after fasting for at least 8 hours.   BUN 9 6 - 20 mg/dL   Creatinine, Ser 2.59 0.61 - 1.24 mg/dL   Calcium 7.8 (L) 8.9 - 10.3 mg/dL   Total Protein 5.4 (L) 6.5 - 8.1 g/dL   Albumin 2.2 (L) 3.5 - 5.0 g/dL   AST 10 (L) 15 - 41 U/L   ALT 10 0 - 44 U/L   Alkaline Phosphatase 42 38 - 126 U/L   Total Bilirubin 0.3 0.0 - 1.2 mg/dL   GFR, Estimated >56 >38 mL/min    Comment: (NOTE) Calculated using the CKD-EPI Creatinine Equation (2021)    Anion gap 7 5 - 15    Comment: Performed at [facility], [address], [place], [place] [address]  CBC     Status: Abnormal   Collection Time: 08/18/23  6:55 AM  Result Value Ref Range   WBC 10.0 4.0 - 10.5 K/uL   RBC 3.60 (L) 4.22 - 5.81 MIL/uL   Hemoglobin 11.2 (L) 13.0 - 17.0 g/dL   HCT 32.9 (L) 51.8 - 84.1 %   MCV 92.5 80.0 - 100.0 fL   MCH 31.1 26.0 - 34.0 pg   MCHC 33.6 30.0 - 36.0 g/dL   RDW 66.0 63.0 - 16.0 %   Platelets 325 150 - 400 K/uL   nRBC 0.0 0.0 - 0.2 %    Comment: Performed at [facility], [address], [place], [place] [address]    CT ABDOMEN PELVIS W CONTRAST Result Date: 08/16/2023 CLINICAL DATA:  Abdominal pain EXAM: CT ABDOMEN AND PELVIS WITH CONTRAST  TECHNIQUE: Multidetector CT imaging of the abdomen and pelvis was performed using the standard protocol following bolus administration of intravenous contrast.  RADIATION DOSE REDUCTION: This exam was performed according to the departmental dose-optimization program which includes automated exposure control, adjustment of the mA and/or kV according to patient size and/or use of iterative reconstruction technique. CONTRAST:  OMNIPAQUE IOHEXOL 300 MG/ML  SOLN COMPARISON:  08/01/2021 FINDINGS: Lower chest: No acute abnormality. Hepatobiliary: No focal hepatic abnormality. Gallbladder unremarkable. Pancreas: No focal abnormality or ductal dilatation. Spleen: No focal abnormality.  Normal size. Adrenals/Urinary Tract: No adrenal abnormality. No focal renal abnormality. No stones or hydronephrosis. Urinary bladder is unremarkable. Stomach/Bowel: Diffuse colonic wall thickening compatible with pancolitis. Normal appendix. Stomach and small bowel decompressed. Vascular/Lymphatic: No evidence of aneurysm or adenopathy. Reproductive: No visible focal abnormality. Other: No free fluid or free air. Musculoskeletal: No acute bony abnormality. IMPRESSION: Diffuse colonic wall thickening compatible with pancolitis. Electronically Signed   By: Charlett Nose M.D.   On: 08/16/2023 23:11    ROS negative except above Blood pressure (!) 123/51, pulse 71, temperature (!) 97.5 F (36.4 C), temperature source Oral, resp. rate 14, height 5\' 7"  (1.702 m), weight 61.2 kg, SpO2 97%. Physical Exam vital signs stable afebrile no acute distress abdomen is soft nontender labs and CT reviewed previous colonoscopy reviewed  Assessment/Plan: Ulcerative colitis Plan: Can probably change him to oral steroids and wean his narcotics and hopefully he can go home soon and keep his follow-up with his primary gastroenterologist and would discharge him on 40 mg of prednisone and stay on that until the appointment and please call us back if we can be of any further assistance with this hospital stay and we answered all of his and his mom's questions and discussed most of the  various medicine options  including various orals versus infusions versus self injections and case discussed with the hospital team as well  [facility] 08/18/2023, 10:39 AM

## 2023-08-18 NOTE — Plan of Care (Signed)
  Problem: Education: Goal: Knowledge of General Education information will improve Description: Including pain rating scale, medication(s)/side effects and non-pharmacologic comfort measures Outcome: Progressing   Problem: Health Behavior/Discharge Planning: Goal: Ability to manage health-related needs will improve Outcome: Progressing   Problem: Clinical Measurements: Goal: Respiratory complications will improve Outcome: Progressing Goal: Cardiovascular complication will be avoided Outcome: Progressing   Problem: Activity: Goal: Risk for activity intolerance will decrease Outcome: Progressing   Problem: Nutrition: Goal: Adequate nutrition will be maintained Outcome: Progressing   Problem: Coping: Goal: Level of anxiety will decrease Outcome: Progressing   Problem: Skin Integrity: Goal: Risk for impaired skin integrity will decrease Outcome: Progressing

## 2023-08-19 DIAGNOSIS — K5100 Ulcerative (chronic) pancolitis without complications: Secondary | ICD-10-CM | POA: Diagnosis not present

## 2023-08-19 LAB — COMPREHENSIVE METABOLIC PANEL
ALT: 9 U/L (ref 0–44)
AST: 10 U/L — ABNORMAL LOW (ref 15–41)
Albumin: 2.2 g/dL — ABNORMAL LOW (ref 3.5–5.0)
Alkaline Phosphatase: 43 U/L (ref 38–126)
Anion gap: 7 (ref 5–15)
BUN: 11 mg/dL (ref 6–20)
CO2: 26 mmol/L (ref 22–32)
Calcium: 8.1 mg/dL — ABNORMAL LOW (ref 8.9–10.3)
Chloride: 101 mmol/L (ref 98–111)
Creatinine, Ser: 0.68 mg/dL (ref 0.61–1.24)
GFR, Estimated: >60 mL/min (ref >60)
Glucose, Bld: 134 mg/dL — ABNORMAL HIGH (ref 70–99)
Potassium: 4.3 mmol/L (ref 3.5–5.1)
Sodium: 134 mmol/L — ABNORMAL LOW (ref 135–145)
Total Bilirubin: 0.2 mg/dL (ref 0.0–1.2)
Total Protein: 5.4 g/dL — ABNORMAL LOW (ref 6.5–8.1)

## 2023-08-19 LAB — CBC
HCT: 34 % — ABNORMAL LOW (ref 39.0–52.0)
Hemoglobin: 11.4 g/dL — ABNORMAL LOW (ref 13.0–17.0)
MCH: 31.4 pg (ref 26.0–34.0)
MCHC: 33.5 g/dL (ref 30.0–36.0)
MCV: 93.7 fL (ref 80.0–100.0)
Platelets: 367 10*3/uL (ref 150–400)
RBC: 3.63 MIL/uL — ABNORMAL LOW (ref 4.22–5.81)
RDW: 14.2 % (ref 11.5–15.5)
WBC: 12.9 10*3/uL — ABNORMAL HIGH (ref 4.0–10.5)
nRBC: 0 % (ref 0.0–0.2)

## 2023-08-19 LAB — MAGNESIUM: Magnesium: 2.3 mg/dL (ref 1.7–2.4)

## 2023-08-19 LAB — PHOSPHORUS: Phosphorus: 4.2 mg/dL (ref 2.5–4.6)

## 2023-08-19 MED ORDER — MELATONIN 5 MG PO TABS
5.0000 mg | ORAL_TABLET | Freq: Every evening | ORAL | Status: DC | PRN
  Administered 2023-08-19: 5 mg via ORAL
  Filled 2023-08-19: qty 1

## 2023-08-19 MED ORDER — SIMETHICONE 80 MG PO CHEW
80.0000 mg | CHEWABLE_TABLET | Freq: Once | ORAL | Status: AC
  Administered 2023-08-19: 80 mg via ORAL
  Filled 2023-08-19: qty 1

## 2023-08-19 NOTE — Plan of Care (Signed)

## 2023-08-19 NOTE — Progress Notes (Signed)
PROGRESS NOTE    Matthew Salas  JYN:[id number] DOB: 03/02/2001 DOA: 08/16/2023 PCP: [facility]    Brief Narrative:   23 year old with history of ulcerative colitis not on any medications at home admitted to the hospital with ulcerative colitis flare/pancolitis.  Patient was diagnosed in 2023 and followed at [facility] initially treated with Entyvio with suboptimal response and was switched to Stelara.  Since he was doing well he stopped taking his medication back in March.  About a month ago started having recurrent abdominal pain with significant episodes of frequent diarrhea.  In the ER noted to have pancolitis with leukocytosis. Upon admission seen by GI, started on steroids.  C. difficile test was negative.  Assessment & Plan:  Principal Problem:   Pancolitis (HCC)   Ulcerative colitis, pancolitis - Initially diagnosed in 2023 treated by [facility] team with Entyvio and later switched to Stelara due to suboptimal response but patient stopped taking it about 10 months ago.  Now having significant flareup which has worsened over the last month. - CT reviewed.  Showing uncomplicated pancolitis - Continue IV steroids, diet as tolerated, pain control, antiemetics, IV fluids - [facility] is consulted. -C diff = neg   DVT prophylaxis: SCDs Start: 08/17/23 1640    Code Status: Full Code Family Communication:  father updated.  Continue hospital stay  Subjective: Abd pain is better.   Examination: General exam: Appears calm and comfortable  Respiratory system: Clear to auscultation. Respiratory effort normal. Cardiovascular system: S1 & S2 heard, RRR. No JVD, murmurs, rubs, gallops or clicks. No pedal edema. Gastrointestinal system: Abdomen is nondistended, soft and nontender. No organomegaly or masses felt. Normal bowel sounds heard. Central nervous system: Alert and oriented. No focal neurological deficits. Extremities: Symmetric 5 x 5 power. Skin:  No rashes, lesions or ulcers Psychiatry: Judgement and insight appear normal. Mood & affect appropriate.       Diet Orders (From admission, onward)     Start     Ordered   08/17/23 1640  Diet full liquid Room service appropriate? Yes; Fluid consistency: Thin  Diet effective now       Question Answer Comment  Room service appropriate? Yes   Fluid consistency: Thin      08/17/23 1640            Objective: Vitals:   08/18/23 1323 08/18/23 2110 08/19/23 0519 08/19/23 1217  BP: (!) 118/54 121/69 (!) 96/51 (!) 121/58  Pulse: 64 (!) 59 (!) 53 65  Resp: 16 16 14 19   Temp: 98.1 F (36.7 C) 97.9 F (36.6 C) 97.6 F (36.4 C) 97.9 F (36.6 C)  TempSrc: Oral Oral Oral Oral  SpO2: 98% 100% 97% 98%  Weight:      Height:        Intake/Output Summary (Last 24 hours) at 08/19/2023 1229 Last data filed at 08/19/2023 1225 Gross per 24 hour  Intake 740.81 ml  Output --  Net 740.81 ml   Filed Weights   08/16/23 2254  Weight: 61.2 kg    Scheduled Meds:  predniSONE  40 mg Oral Q breakfast   Continuous Infusions:  Nutritional status     Body mass index is 21.13 kg/m.  Data Reviewed:   CBC: Recent Labs  Lab 08/17/23 1706 08/18/23 0655 08/19/23 0453  WBC 12.5* 10.0 12.9*  NEUTROABS 8.9*  --   --   HGB 12.7* 11.2* 11.4*  HCT 38.2* 33.3* 34.0*  MCV 92.9 92.5 93.7  PLT 379 325 367   Basic Metabolic Panel: Recent Labs  Lab 08/17/23 1706 08/18/23 0655 08/19/23 0453  NA 136 132* 134*  K 4.2 4.0 4.3  CL 99 100 101  CO2 29 25 26   GLUCOSE 140* 153* 134*  BUN 9 9 11   CREATININE 0.83 0.76 0.68  CALCIUM 8.4* 7.8* 8.1*  MG  --   --  2.3  PHOS  --   --  4.2   GFR: Estimated Creatinine Clearance: 125.4 mL/min (by C-G formula based on SCr of 0.68 mg/dL). Liver Function Tests: Recent Labs  Lab 08/17/23 1706 08/18/23 0655 08/19/23 0453  AST 10* 10* 10*  ALT 8 10 9   ALKPHOS 54 42 43  BILITOT 0.5 0.3 0.2  PROT 6.1* 5.4* 5.4*  ALBUMIN 2.6* 2.2* 2.2*   Recent  Labs  Lab 08/16/23 1952  LIPASE <10*   No results for input(s): "AMMONIA" in the last 168 hours. Coagulation Profile: No results for input(s): "INR", "PROTIME" in the last 168 hours. Cardiac Enzymes: No results for input(s): "CKTOTAL", "CKMB", "CKMBINDEX", "TROPONINI" in the last 168 hours. BNP (last 3 results) No results for input(s): "PROBNP" in the last 8760 hours. HbA1C: No results for input(s): "HGBA1C" in the last 72 hours. CBG: No results for input(s): "GLUCAP" in the last 168 hours. Lipid Profile: No results for input(s): "CHOL", "HDL", "LDLCALC", "TRIG", "CHOLHDL", "LDLDIRECT" in the last 72 hours. Thyroid Function Tests: No results for input(s): "TSH", "T4TOTAL", "FREET4", "T3FREE", "THYROIDAB" in the last 72 hours. Anemia Panel: No results for input(s): "VITAMINB12", "FOLATE", "FERRITIN", "TIBC", "IRON", "RETICCTPCT" in the last 72 hours. Sepsis Labs: No results for input(s): "PROCALCITON", "LATICACIDVEN" in the last 168 hours.  Recent Results (from the past 240 hours)  C Difficile Quick Screen w PCR reflex     Status: None   Collection Time: 08/18/23  3:16 PM   Specimen: STOOL  Result Value Ref Range Status   C Diff antigen NEGATIVE NEGATIVE Final   C Diff toxin NEGATIVE NEGATIVE Final   C Diff interpretation No C. difficile detected.  Final    Comment: Performed at [facility], [address], [place], [place] [address]         Radiology Studies: No results found.         LOS: 1 day   Time spent= 35 mins    Miguel Rota, MD [facility]  If 7PM-7AM, please contact night-coverage  08/19/2023, 12:29 PM

## 2023-08-20 ENCOUNTER — Other Ambulatory Visit (HOSPITAL_COMMUNITY): Payer: Self-pay

## 2023-08-20 DIAGNOSIS — K5100 Ulcerative (chronic) pancolitis without complications: Secondary | ICD-10-CM | POA: Diagnosis not present

## 2023-08-20 LAB — COMPREHENSIVE METABOLIC PANEL
ALT: 11 U/L (ref 0–44)
AST: 9 U/L — ABNORMAL LOW (ref 15–41)
Albumin: 2.4 g/dL — ABNORMAL LOW (ref 3.5–5.0)
Alkaline Phosphatase: 37 U/L — ABNORMAL LOW (ref 38–126)
Anion gap: 6 (ref 5–15)
BUN: 12 mg/dL (ref 6–20)
CO2: 25 mmol/L (ref 22–32)
Calcium: 8.1 mg/dL — ABNORMAL LOW (ref 8.9–10.3)
Chloride: 103 mmol/L (ref 98–111)
Creatinine, Ser: 0.63 mg/dL (ref 0.61–1.24)
GFR, Estimated: >60 mL/min (ref >60)
Glucose, Bld: 100 mg/dL — ABNORMAL HIGH (ref 70–99)
Potassium: 3.8 mmol/L (ref 3.5–5.1)
Sodium: 134 mmol/L — ABNORMAL LOW (ref 135–145)
Total Bilirubin: 0.4 mg/dL (ref 0.0–1.2)
Total Protein: 5.5 g/dL — ABNORMAL LOW (ref 6.5–8.1)

## 2023-08-20 LAB — CBC
HCT: 35.8 % — ABNORMAL LOW (ref 39.0–52.0)
Hemoglobin: 11.9 g/dL — ABNORMAL LOW (ref 13.0–17.0)
MCH: 30.7 pg (ref 26.0–34.0)
MCHC: 33.2 g/dL (ref 30.0–36.0)
MCV: 92.5 fL (ref 80.0–100.0)
Platelets: 394 10*3/uL (ref 150–400)
RBC: 3.87 MIL/uL — ABNORMAL LOW (ref 4.22–5.81)
RDW: 14.5 % (ref 11.5–15.5)
WBC: 11.9 10*3/uL — ABNORMAL HIGH (ref 4.0–10.5)
nRBC: 0.4 % — ABNORMAL HIGH (ref 0.0–0.2)

## 2023-08-20 LAB — MAGNESIUM: Magnesium: 2.3 mg/dL (ref 1.7–2.4)

## 2023-08-20 MED ORDER — BISACODYL 5 MG PO TBEC
10.0000 mg | DELAYED_RELEASE_TABLET | Freq: Every day | ORAL | 0 refills | Status: AC | PRN
  Filled 2023-08-20: qty 60, 30d supply, fill #0

## 2023-08-20 MED ORDER — ONDANSETRON 4 MG PO TBDP
4.0000 mg | ORAL_TABLET | Freq: Three times a day (TID) | ORAL | 0 refills | Status: AC | PRN
  Filled 2023-08-20: qty 21, 30d supply, fill #0

## 2023-08-20 MED ORDER — OXYCODONE HCL 5 MG PO TABS
5.0000 mg | ORAL_TABLET | Freq: Four times a day (QID) | ORAL | 0 refills | Status: AC | PRN
  Filled 2023-08-20: qty 30, 7d supply, fill #0

## 2023-08-20 MED ORDER — PREDNISONE 20 MG PO TABS
40.0000 mg | ORAL_TABLET | Freq: Every day | ORAL | 0 refills | Status: AC
  Filled 2023-08-20: qty 60, 30d supply, fill #0

## 2023-08-20 MED ORDER — TRAMADOL HCL 50 MG PO TABS
50.0000 mg | ORAL_TABLET | Freq: Three times a day (TID) | ORAL | 0 refills | Status: AC | PRN
  Filled 2023-08-20: qty 30, 10d supply, fill #0

## 2023-08-20 NOTE — Discharge Summary (Signed)
Physician Discharge Summary  Matthew Salas ZOX:[id number] DOB: June 08, 2001 DOA: 08/16/2023  PCP: [facility]  Admit date: 08/16/2023 Discharge date: 08/20/2023  Admitted From: Home Disposition: Home  Recommendations for Outpatient Follow-up:  Follow up with PCP in 1-2 weeks Please obtain BMP/CBC in one week your next doctors visit.  P.o. prednisone prescribed.  Needs to follow-up outpatient [facility] for his ulcerative colitis Pain medication including tramadol and oxycodone along with bowel regimen prescribed   Discharge Condition: Stable CODE STATUS: Full code Diet recommendation: Full liquid/soft diet.  Slowly advance as tolerated at home  Brief/Interim Summary: Brief Narrative:   23 year old with history of ulcerative colitis not on any medications at home admitted to the hospital with ulcerative colitis flare/pancolitis.  Patient was diagnosed in 2023 and followed at [facility] initially treated with Entyvio with suboptimal response and was switched to Stelara.  Since he was doing well he stopped taking his medication back in March.  About a month ago started having recurrent abdominal pain with significant episodes of frequent diarrhea.  In the ER noted to have pancolitis with leukocytosis. Upon admission seen by GI, started on steroids.  C. difficile test was negative.  Slowly advancing diet as tolerated. Today is doing significantly well therefore we will discharge him.  Slowly advance his diet as tolerated at home.  Tolerating p.o. at this time.  He will also make outpatient follow-up arrangement with [facility].  Mother aware who is present at bedside.  Assessment & Plan:  Principal Problem:   Pancolitis (HCC)   Ulcerative colitis, pancolitis - Initially diagnosed in 2023 treated by [facility] team with Entyvio and later switched to Stelara due to suboptimal response but patient stopped taking it about 10 months ago.  Now he is  having a flareup, CT shows uncomplicated pancolitis.  Seen by [facility].  Patient will be on prolonged prednisone until seen by outpatient [facility].  Patient father is in touch with [facility] who will give her an appointment once discharged from the hospital. - [facility] is consulted. -C diff = neg   DVT prophylaxis: SCDs Start: 08/17/23 1640    Code Status: Full Code Family Communication: Mother at bedside Discharged today  Subjective:  Doing well wishing to go home.  Tolerating p.o.  Examination: General exam: Appears calm and comfortable  Respiratory system: Clear to auscultation. Respiratory effort normal. Cardiovascular system: S1 & S2 heard, RRR. No JVD, murmurs, rubs, gallops or clicks. No pedal edema. Gastrointestinal system: Abdomen is nondistended, soft and nontender. No organomegaly or masses felt. Normal bowel sounds heard. Central nervous system: Alert and oriented. No focal neurological deficits. Extremities: Symmetric 5 x 5 power. Skin: No rashes, lesions or ulcers Psychiatry: Judgement and insight appear normal. Mood & affect appropriate.    Discharge Diagnoses:  Principal Problem:   Pancolitis [facility]) Active Problems:   Colitis      Discharge Exam: Vitals:   08/19/23 2138 08/20/23 0551  BP: (!) 102/56 114/61  Pulse: (!) 56 (!) 58  Resp: 14 14  Temp: (!) 97.5 F (36.4 C) 97.9 F (36.6 C)  SpO2: 98% 99%   Vitals:   08/19/23 1217 08/19/23 1516 08/19/23 2138 08/20/23 0551  BP: (!) 121/58 115/64 (!) 102/56 114/61  Pulse: 65 (!) 58 (!) 56 (!) 58  Resp: 19 16 14 14   Temp: 97.9 F (36.6 C) 97.8 F (36.6 C) (!) 97.5 F (36.4 C) 97.9 F (36.6 C)  TempSrc: Oral Oral Oral  Oral  SpO2: 98% 98% 98% 99%  Weight:      Height:          Discharge Instructions   Allergies as of 08/20/2023   No Known Allergies      Medication List     TAKE these medications    albuterol 108 (90 Base) MCG/ACT inhaler Commonly known as: VENTOLIN HFA Inhale 1-2  puffs into the lungs as needed for wheezing or shortness of breath (as needed before exercise).   bisacodyl 5 MG EC tablet Generic drug: bisacodyl Take 2 tablets (10 mg total) by mouth daily as needed for moderate constipation or severe constipation.   ondansetron 4 MG disintegrating tablet Commonly known as: ZOFRAN-ODT Take 1 tablet (4 mg total) by mouth every 8 (eight) hours as needed for nausea or vomiting.   oxyCODONE 5 MG immediate release tablet Commonly known as: Oxy IR/ROXICODONE Take 1 tablet (5 mg total) by mouth every 6 (six) hours as needed for severe pain (pain score 7-10).   predniSONE 20 MG tablet Commonly known as: DELTASONE Take 2 tablets (40 mg total) by mouth daily with breakfast. Start taking on: August 21, 2023   Stelara 90 MG/ML Sosy injection Generic drug: ustekinumab Inject 90 mg into the skin See admin instructions. Every 8 weeks.   traMADol 50 MG tablet Commonly known as: ULTRAM Take 1 tablet (50 mg total) by mouth every 8 (eight) hours as needed for moderate pain (pain score 4-6).        Follow-up Information     [facility] Follow up.   Specialty: Family Medicine Contact information: [address] [place] [address] [phone]                No Known Allergies  You were cared for by a hospitalist during your hospital stay. If you have any questions about your discharge medications or the care you received while you were in the hospital after you are discharged, you can call the unit and asked to speak with the hospitalist on call if the hospitalist that took care of you is not available. Once you are discharged, your primary care physician will handle any further medical issues. Please note that no refills for any discharge medications will be authorized once you are discharged, as it is imperative that you return to your primary care physician (or establish a relationship with a primary care physician  if you do not have one) for your aftercare needs so that they can reassess your need for medications and monitor your lab values.  You were cared for by a hospitalist during your hospital stay. If you have any questions about your discharge medications or the care you received while you were in the hospital after you are discharged, you can call the unit and asked to speak with the hospitalist on call if the hospitalist that took care of you is not available. Once you are discharged, your primary care physician will handle any further medical issues. Please note that NO REFILLS for any discharge medications will be authorized once you are discharged, as it is imperative that you return to your primary care physician (or establish a relationship with a primary care physician if you do not have one) for your aftercare needs so that they can reassess your need for medications and monitor your lab values.  Please request your Prim.MD to go over all Hospital Tests and Procedure/Radiological results at the follow up, please get all [facility]  records sent to your Prim MD by signing hospital release before you go home.  Get CBC, CMP, 2 view Chest X ray checked  by Primary MD during your next visit or SNF MD in 5-7 days ( we routinely change or add medications that can affect your baseline labs and fluid status, therefore we recommend that you get the mentioned basic workup next visit with your PCP, your PCP may decide not to get them or add new tests based on their clinical decision)  On your next visit with your primary care physician please Get Medicines reviewed and adjusted.  If you experience worsening of your admission symptoms, develop shortness of breath, life threatening emergency, suicidal or homicidal thoughts you must seek medical attention immediately by calling 911 or calling your MD immediately  if symptoms less severe.  You Must read complete instructions/literature along with all the possible  adverse reactions/side effects for all the Medicines you take and that have been prescribed to you. Take any new Medicines after you have completely understood and accpet all the possible adverse reactions/side effects.   Do not drive, operate heavy machinery, perform activities at heights, swimming or participation in water activities or provide baby sitting services if your were admitted for syncope or siezures until you have seen by Primary MD or a Neurologist and advised to do so again.  Do not drive when taking Pain medications.   Procedures/Studies: CT ABDOMEN PELVIS W CONTRAST Result Date: 08/16/2023 CLINICAL DATA:  Abdominal pain EXAM: CT ABDOMEN AND PELVIS WITH CONTRAST TECHNIQUE: Multidetector CT imaging of the abdomen and pelvis was performed using the standard protocol following bolus administration of intravenous contrast. RADIATION DOSE REDUCTION: This exam was performed according to the departmental dose-optimization program which includes automated exposure control, adjustment of the mA and/or kV according to patient size and/or use of iterative reconstruction technique. CONTRAST:  OMNIPAQUE IOHEXOL 300 MG/ML  SOLN COMPARISON:  08/01/2021 FINDINGS: Lower chest: No acute abnormality. Hepatobiliary: No focal hepatic abnormality. Gallbladder unremarkable. Pancreas: No focal abnormality or ductal dilatation. Spleen: No focal abnormality.  Normal size. Adrenals/Urinary Tract: No adrenal abnormality. No focal renal abnormality. No stones or hydronephrosis. Urinary bladder is unremarkable. Stomach/Bowel: Diffuse colonic wall thickening compatible with pancolitis. Normal appendix. Stomach and small bowel decompressed. Vascular/Lymphatic: No evidence of aneurysm or adenopathy. Reproductive: No visible focal abnormality. Other: No free fluid or free air. Musculoskeletal: No acute bony abnormality. IMPRESSION: Diffuse colonic wall thickening compatible with pancolitis. Electronically Signed   By:  Charlett Nose M.D.   On: 08/16/2023 23:11     The results of significant diagnostics from this hospitalization (including imaging, microbiology, ancillary and laboratory) are listed below for reference.     Microbiology: Recent Results (from the past 240 hours)  C Difficile Quick Screen w PCR reflex     Status: None   Collection Time: 08/18/23  3:16 PM   Specimen: STOOL  Result Value Ref Range Status   C Diff antigen NEGATIVE NEGATIVE Final   C Diff toxin NEGATIVE NEGATIVE Final   C Diff interpretation No C. difficile detected.  Final    Comment: Performed at [facility], [address] [place], [place] [address]     Labs: BNP (last 3 results) No results for input(s): "BNP" in the last 8760 hours. Basic Metabolic Panel: Recent Labs  Lab 08/17/23 1706 08/18/23 0655 08/19/23 0453 08/20/23 0546  NA 136 132* 134* 134*  K 4.2 4.0 4.3 3.8  CL 99 100 101 103  CO2 29 25 26 25   GLUCOSE 140* 153* 134* 100*  BUN 9 9 11 12   CREATININE 0.83 0.76 0.68 0.63  CALCIUM 8.4* 7.8* 8.1* 8.1*  MG  --   --  2.3 2.3  PHOS  --   --  4.2  --    Liver Function Tests: Recent Labs  Lab 08/17/23 1706 08/18/23 0655 08/19/23 0453 08/20/23 0546  AST 10* 10* 10* 9*  ALT 8 10 9 11   ALKPHOS 54 42 43 37*  BILITOT 0.5 0.3 0.2 0.4  PROT 6.1* 5.4* 5.4* 5.5*  ALBUMIN 2.6* 2.2* 2.2* 2.4*   Recent Labs  Lab 08/16/23 1952  LIPASE <10*   No results for input(s): "AMMONIA" in the last 168 hours. CBC: Recent Labs  Lab 08/17/23 1706 08/18/23 0655 08/19/23 0453 08/20/23 0546  WBC 12.5* 10.0 12.9* 11.9*  NEUTROABS 8.9*  --   --   --   HGB 12.7* 11.2* 11.4* 11.9*  HCT 38.2* 33.3* 34.0* 35.8*  MCV 92.9 92.5 93.7 92.5  PLT 379 325 367 394   Cardiac Enzymes: No results for input(s): "CKTOTAL", "CKMB", "CKMBINDEX", "TROPONINI" in the last 168 hours. BNP: Invalid input(s): "POCBNP" CBG: No results for input(s): "GLUCAP" in the last 168 hours. D-Dimer No results for  input(s): "DDIMER" in the last 72 hours. Hgb A1c No results for input(s): "HGBA1C" in the last 72 hours. Lipid Profile No results for input(s): "CHOL", "HDL", "LDLCALC", "TRIG", "CHOLHDL", "LDLDIRECT" in the last 72 hours. Thyroid function studies No results for input(s): "TSH", "T4TOTAL", "T3FREE", "THYROIDAB" in the last 72 hours.  Invalid input(s): "FREET3" Anemia work up No results for input(s): "VITAMINB12", "FOLATE", "FERRITIN", "TIBC", "IRON", "RETICCTPCT" in the last 72 hours. Urinalysis    Component Value Date/Time   COLORURINE YELLOW 07/06/2021 1816   APPEARANCEUR CLEAR 07/06/2021 1816   LABSPEC 1.010 07/06/2021 1816   PHURINE 6.5 07/06/2021 1816   GLUCOSEU NEGATIVE 07/06/2021 1816   HGBUR NEGATIVE 07/06/2021 1816   BILIRUBINUR NEGATIVE 07/06/2021 1816   KETONESUR NEGATIVE 07/06/2021 1816   PROTEINUR NEGATIVE 07/06/2021 1816   NITRITE NEGATIVE 07/06/2021 1816   LEUKOCYTESUR NEGATIVE 07/06/2021 1816   Sepsis Labs Recent Labs  Lab 08/17/23 1706 08/18/23 0655 08/19/23 0453 08/20/23 0546  WBC 12.5* 10.0 12.9* 11.9*   Microbiology Recent Results (from the past 240 hours)  C Difficile Quick Screen w PCR reflex     Status: None   Collection Time: 08/18/23  3:16 PM   Specimen: STOOL  Result Value Ref Range Status   C Diff antigen NEGATIVE NEGATIVE Final   C Diff toxin NEGATIVE NEGATIVE Final   C Diff interpretation No C. difficile detected.  Final    Comment: Performed at [facility], [address], [place], [place] [address]     Time coordinating discharge:  I have spent 35 minutes face to face with the patient and on the ward discussing the patients care, assessment, plan and disposition with other care givers. >50% of the time was devoted counseling the patient about the risks and benefits of treatment/Discharge disposition and coordinating care.   SIGNED:   Miguel Rota, MD  [facility] 08/20/2023, 1:08 PM   If 7PM-7AM,  please contact night-coverage

## 2023-08-20 NOTE — Plan of Care (Signed)

## 2023-08-20 NOTE — Progress Notes (Signed)
AVS reviewed with PT and his mom. No inquiries made. PT escorted to lobby via wheelchair.

## 2023-09-03 ENCOUNTER — Other Ambulatory Visit (HOSPITAL_COMMUNITY): Payer: Self-pay

## 2023-09-05 ENCOUNTER — Other Ambulatory Visit (HOSPITAL_COMMUNITY): Payer: Self-pay

## 2023-09-17 MED ORDER — STELARA 90 MG/ML SUBCUTANEOUS SYRINGE
SUBCUTANEOUS | 5 refills | 56.00 days
Start: 2023-09-17 — End: ?

## 2023-09-18 NOTE — Unmapped (Signed)
 [facility] Specialty Medication Onboarding    Specialty Medication: Stelara  Prior Authorization: Not Required   Financial Assistance: Yes - copay card approved as secondary   Final Copay/Day Supply: $0 / 56 days    Insurance Restrictions: None     Notes to Pharmacist:   Credit Card on File: not applicable  Start Date on Rx:      The triage team has completed the benefits investigation and has determined that the patient is able to fill this medication at [facility]. Please contact the patient to complete the onboarding or follow up with the prescribing physician as needed.

## 2023-09-18 NOTE — Unmapped (Addendum)
 2/24: mom called in to verify dates - she said her provider gave her information that since infusion was on 2/18, first subcutaneous injection would be 4/15. Per below notes this matches up. Mom aware pharmacist will reach out for outreach end of March to get first delivery set up-ef      Stelara Q8W approved for $0 copay.  Patient outreach scheduled for 2 weeks before first subcutaneous dose on 10/25/23.  Sent patient Mychart message with link to video and communicated outreach.   IV infusion: 09/17/23  1st subcutaneous dose due: 11/12/23    Delila Spence, BS PharmD  [facility]  [address], [place], [place] [address]   Phone: [phone] Ext 4, 2 - Fax. [phone]

## 2023-09-20 ENCOUNTER — Other Ambulatory Visit (HOSPITAL_COMMUNITY): Payer: Self-pay

## 2023-10-21 IMAGING — CT CT ABD-PELV W/ CM
2 of 4 series · 15 of 46 positions shown, 17 images · IV contrast (Omnipaque or Isovue)
Comparison: None.
COMPARISON: None.

Addendum:
CLINICAL DATA: One week of abdominal pain and diarrhea.

EXAM:
CT ABDOMEN AND PELVIS WITH CONTRAST
TECHNIQUE: Multidetector CT imaging of the abdomen and pelvis was performed
using the standard protocol following bolus administration of
intravenous contrast.
CONTRAST:  100mL OMNIPAQUE IOHEXOL 300 MG/ML  SOLN

[Series 2: axial st · axial · 0.65mm/px · z∈[+768,+1188]mm · 12 of 94 slices shown, 14 images]
[im 5/94  soft-tissue]
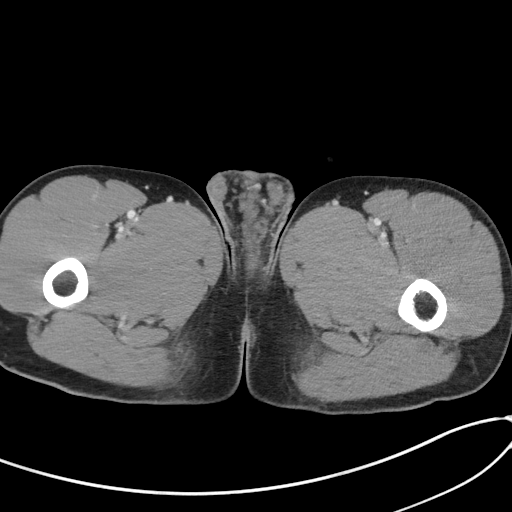
[im 5/94  bone]
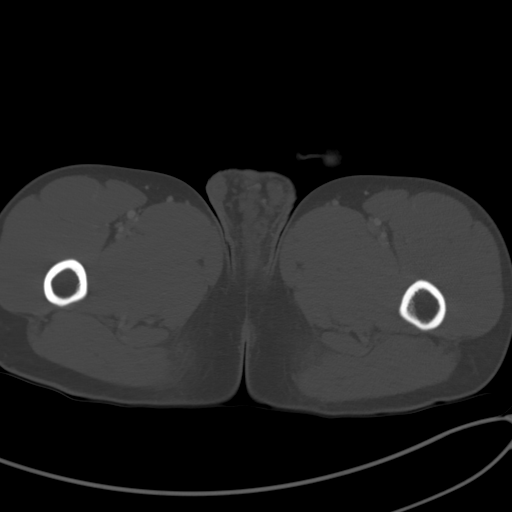
[im 13/94  soft-tissue]
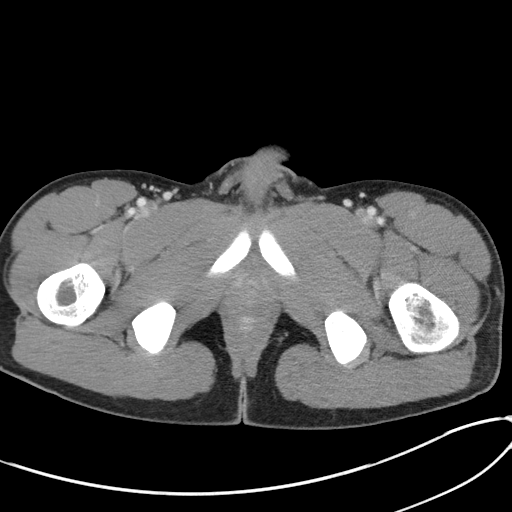
[im 21/94  soft-tissue]
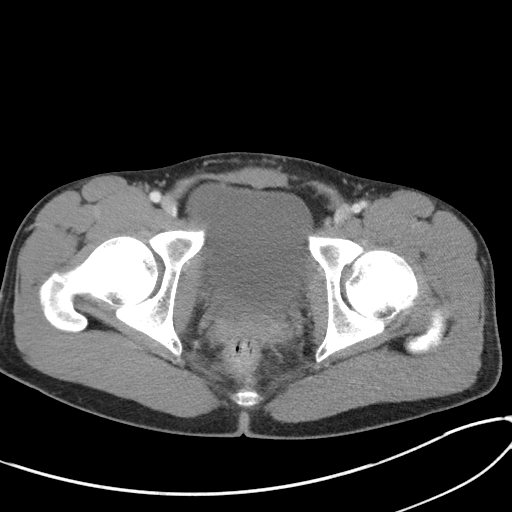
[im 29/94  soft-tissue]
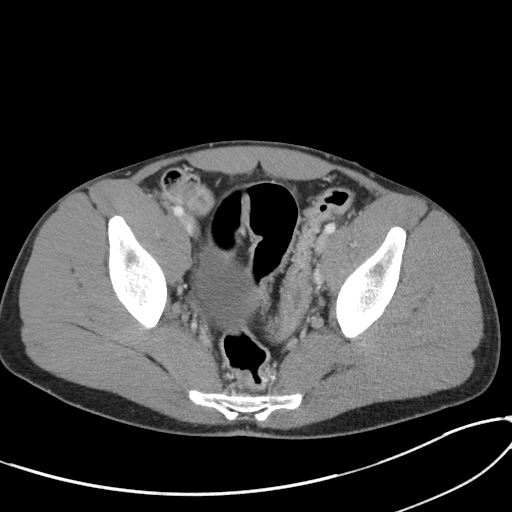
[im 37/94  soft-tissue]
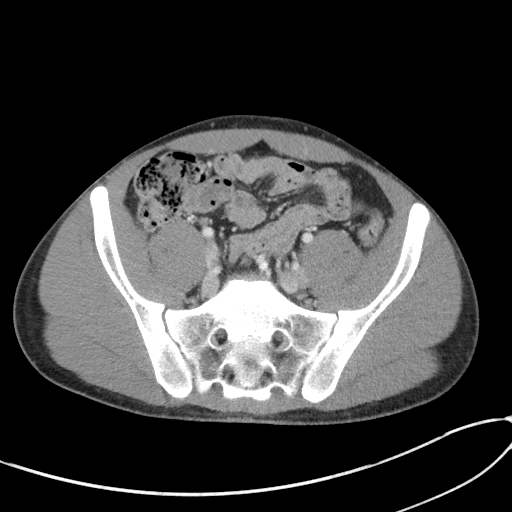
[im 45/94  soft-tissue]
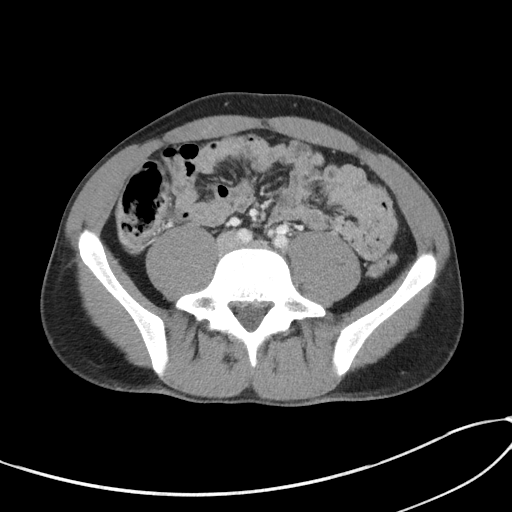
[im 49/94  soft-tissue]
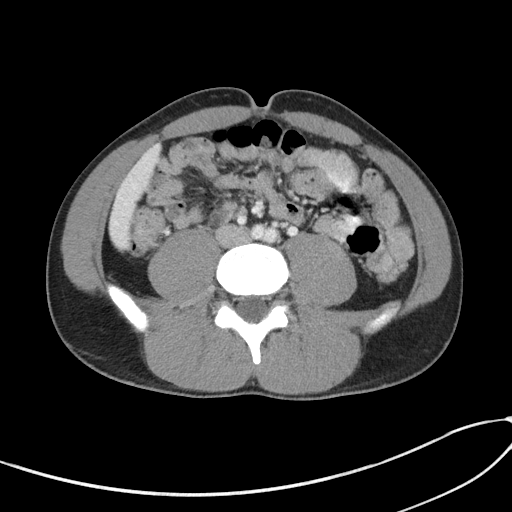
[im 57/94  soft-tissue]
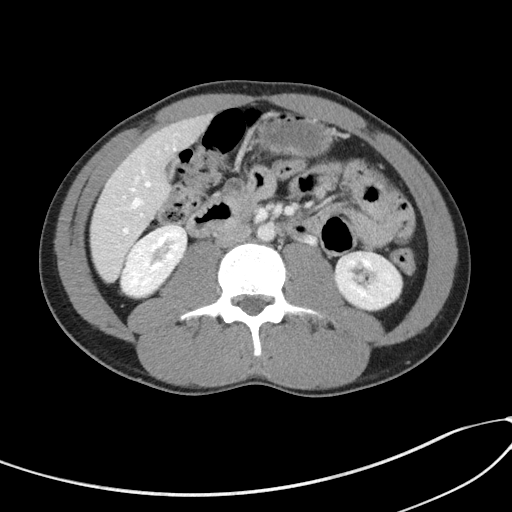
[im 65/94  soft-tissue]
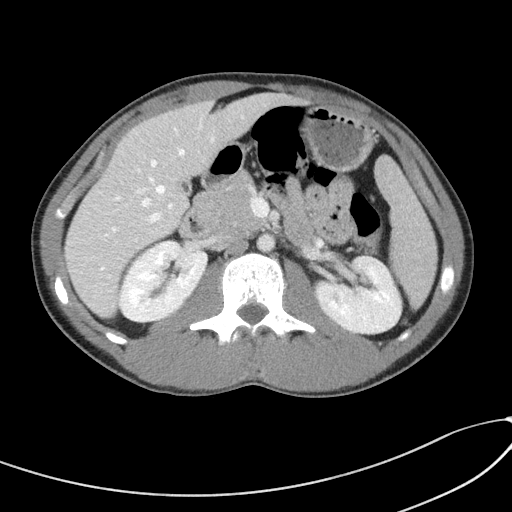
[im 65/94  bone]
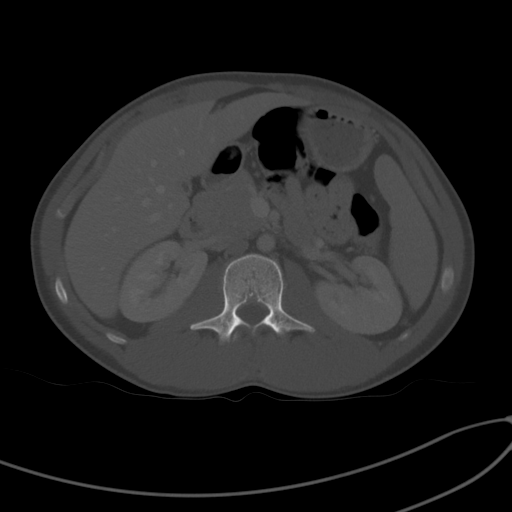
[im 73/94  soft-tissue]
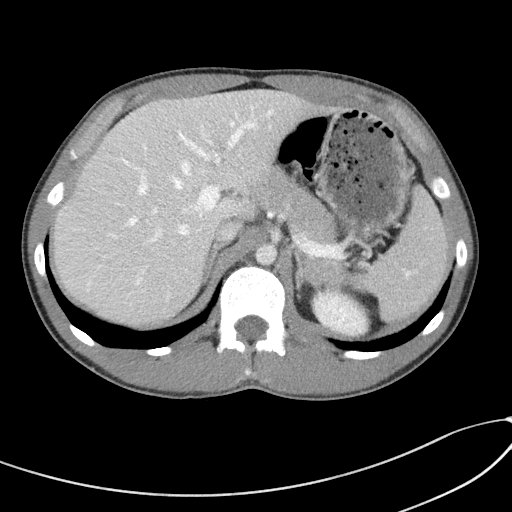
[im 81/94  soft-tissue]
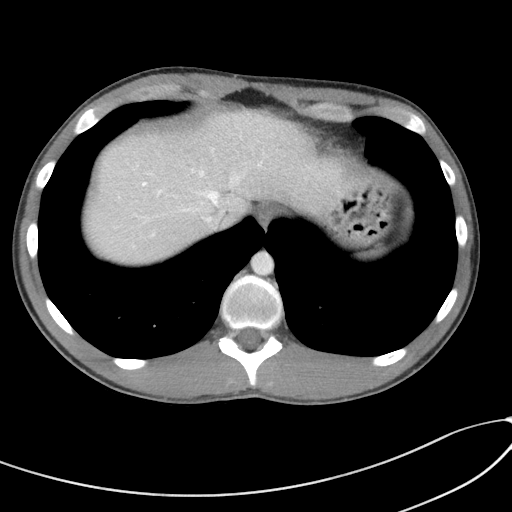
[im 89/94  soft-tissue]
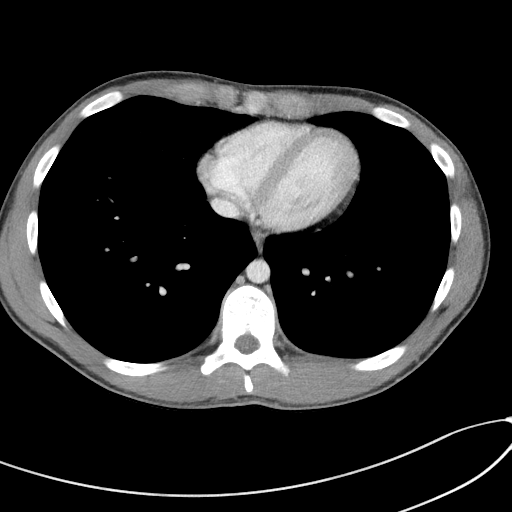

[Series 5: coronal st · coronal · 0.72mm/px · 3 of 86 slices shown]
[im 29/86  soft-tissue]
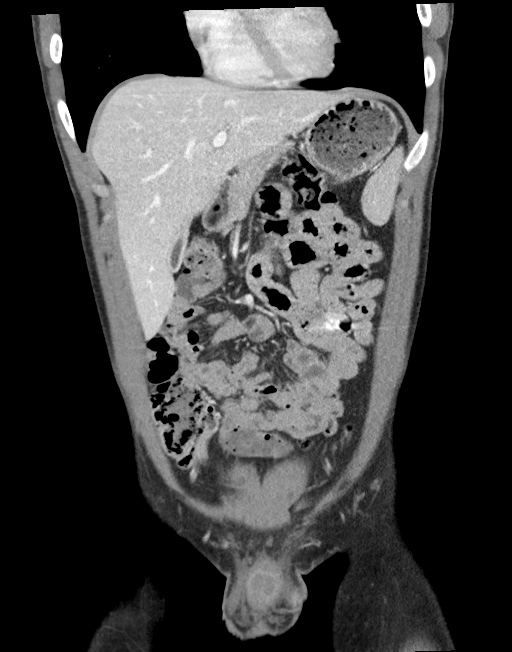
[im 38/86  soft-tissue]
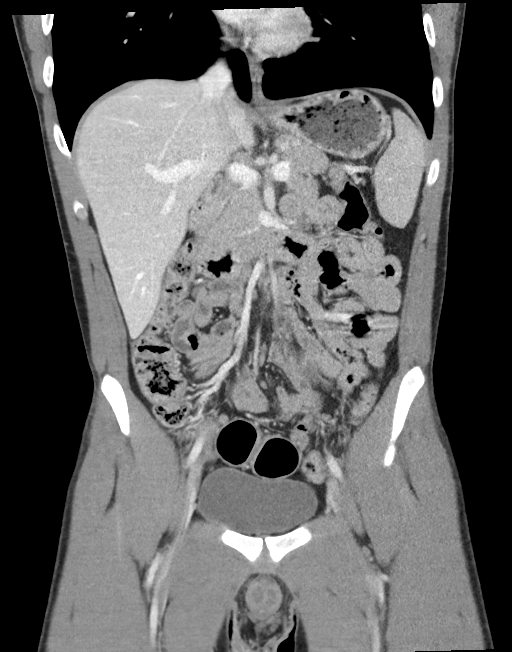
[im 48/86  soft-tissue]
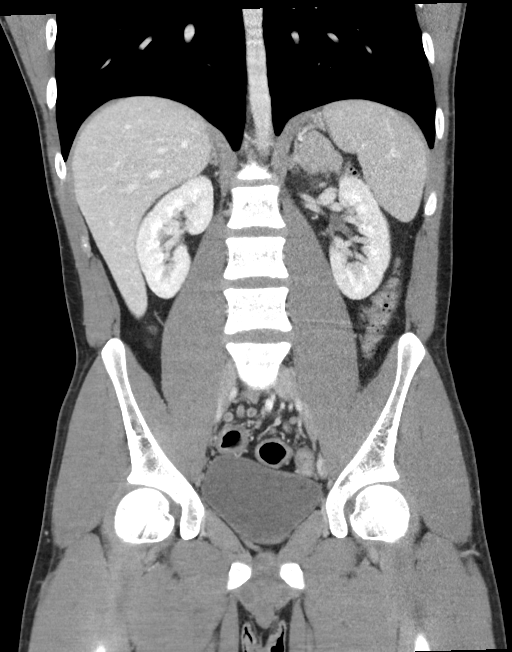

[15 of 46 positions shown; findings below may reference images not displayed]

FINDINGS: Lower chest: No acute abnormality.

Hepatobiliary: No suspicious hepatic lesion. Gallbladder is
decompressed. No biliary ductal dilation.

Pancreas: No pancreatic ductal dilation or evidence of acute
inflammation.

Spleen: Within normal limits.

Adrenals/Urinary Tract: Adrenal glands are unremarkable. Kidneys are
normal, without renal calculi, focal lesion, or hydronephrosis.
Bladder is unremarkable.

Stomach/Bowel: No enteric contrast was administered. Stomach is
unremarkable for degree of distension. No pathologic dilation of
small or large bowel. Normal appendix. Inflammation of the terminal
ileum. Mild pancolonic inflammation. No pneumatosis.

Vascular/Lymphatic: No significant vascular findings are present. No
pathologically enlarged abdominal or pelvic lymph nodes.

Reproductive: Prostate is unremarkable.

Other: No significant abdominopelvic free fluid.

Musculoskeletal: No acute or significant osseous findings.
IMPRESSION: Mild pancolonic and terminal ileum trauma no evidence of perforation
or walled off fluid collections. Findings are suggestive of
infectious or inflammatory enterocolitis.

ADDENDUM:
Dictation error involving the impression section of the report. The
impression should state:

Mild pancolonic and terminal ileal INFLAMMATION without evidence of
perforation or walled off fluid collections. Findings are suggestive
of infectious or inflammatory enterocolitis.

*** End of Addendum ***
FINDINGS: Lower chest: No acute abnormality.

Hepatobiliary: No suspicious hepatic lesion. Gallbladder is
decompressed. No biliary ductal dilation.

Pancreas: No pancreatic ductal dilation or evidence of acute
inflammation.

Spleen: Within normal limits.

Adrenals/Urinary Tract: Adrenal glands are unremarkable. Kidneys are
normal, without renal calculi, focal lesion, or hydronephrosis.
Bladder is unremarkable.

Stomach/Bowel: No enteric contrast was administered. Stomach is
unremarkable for degree of distension. No pathologic dilation of
small or large bowel. Normal appendix. Inflammation of the terminal
ileum. Mild pancolonic inflammation. No pneumatosis.

Vascular/Lymphatic: No significant vascular findings are present. No
pathologically enlarged abdominal or pelvic lymph nodes.

Reproductive: Prostate is unremarkable.

Other: No significant abdominopelvic free fluid.

Musculoskeletal: No acute or significant osseous findings.
IMPRESSION: Mild pancolonic and terminal ileum trauma no evidence of perforation
or walled off fluid collections. Findings are suggestive of
infectious or inflammatory enterocolitis.

## 2023-10-25 NOTE — Unmapped (Signed)
 [facility]    Patient Onboarding/Medication Counseling    Danny Jones is a 23 y.o. male with UC who I am counseling today on continuation of therapy.  I am speaking to the patient's family member, mother .    Was a Nurse, learning disability used for this call? No    Verified patient's date of birth / HIPAA.    Specialty medication(s) to be sent: Inflammatory Disorders: Stelara      Non-specialty medications/supplies to be sent: N/A      Medications not needed at this time: N/A       The patient declined counseling on medication administration, missed dose instructions, goals of therapy, side effects and monitoring parameters, warnings and precautions, drug/food interactions, and storage, handling precautions, and disposal because they have taken the medication previously. The information in the declined sections below are for informational purposes only and was not discussed with patient.     Stelara (ustekinumab)    Medication & Administration     Dosage: Ulcerative colitis: Inject 90mg  under the skin every 8 weeks starting 8 weeks after IV induction dose  IV infusion 09/17/23 and first subcutaneous dose due 11/12/23    Lab tests required prior to treatment initiation:  Tuberculosis: Tuberculosis screening resulted in a non-reactive Quantiferon TB Gold assay.    Administration:     Artist all supplies needed for injection on a clean, flat working surface: medication syringe(s) removed from packaging, alcohol swab, sharps container, etc.  Look at the medication label - look for correct medication, correct dose, and check the expiration date  Look at the medication - the liquid in the syringe should appear clear and colorless to slightly yellow, you may see a few white particles  Lay the syringe on a flat surface and allow it to warm up to room temperature for at least 15-30 minutes  Select injection site - you can use the front of your thigh or your belly (but not the area 2 inches around your belly button); if someone else is giving you the injection you can also use your upper arm in the skin covering your triceps muscle or in the buttocks  Prepare injection site - wash your hands and clean the skin at the injection site with an alcohol swab and let it air dry, do not touch the injection site again before the injection  Pull off the needle safety cap, do not remove until immediately prior to injection  Pinch the skin - with your hand not holding the syringe pinch up a fold of skin at the injection site using your forefinger and thumb  Insert the needle into the fold of skin at about a 45 degree angle - it's best to use a quick dart-like motion  Push the plunger down slowly as far as it will go until the syringe is empty, if the plunger is not fully depressed the needle shield will not extend to cover the needle when it is removed, hold the syringe in place for a full 5 seconds  Check that the syringe is empty and keep pressing down on the plunger while you pull the needle out at the same angle as inserted; after the needle is removed completely from the skin, release the plunger allowing the needle shield to activate and cover the used needle  Dispose of the used syringe immediately in your sharps disposal container, do not attempt to recap the needle prior to disposing  If you see any  blood at the injection site, press a cotton ball or gauze on the site and maintain pressure until the bleeding stops, do not rub the injection site      Adherence/Missed dose instructions:  If your injection is given more than 7-10 days after your scheduled injection date - consult your pharmacist for additional instructions on how to adjust your dosing schedule.    Goals of Therapy     Ulcerative colitis  Achieve remission of symptoms  Maintain remission of symptoms  Minimize long-term systemic glucocorticoid use  Prevent need for surgical procedures  Maintenance of effective psychosocial functioning      Side Effects & Monitoring Parameters     Injection site reaction (redness, irritation, inflammation localized to the site of administration)  Signs of a common cold - minor sore throat, runny or stuffy nose, etc.  Feeling tired or weak  Headache    The following side effects should be reported to the provider:  Signs of a hypersensitivity reaction - rash; hives; itching; red, swollen, blistered, or peeling skin; wheezing; tightness in the chest or throat; difficulty breathing, swallowing, or talking; swelling of the mouth, face, lips, tongue, or throat; etc.  Reduced immune function - report signs of infection such as fever; chills; body aches; very bad sore throat; ear or sinus pain; cough; more sputum or change in color of sputum; pain with passing urine; wound that will not heal, etc.  Also at a slightly higher risk of some malignancies (mainly skin and blood cancers) due to this reduced immune function.  In the case of signs of infection - the patient should hold the next dose of Stelara?? and call your primary care provider to ensure adequate medical care.  Treatment may be resumed when infection is treated and patient is asymptomatic.  Changes in skin - a new growth or lump that forms; changes in shape, size, or color of a previous mole or marking  Shortness of breath or chest pain  Vaginal itching or discharge      Contraindications, Warnings, & Precautions     Have your bloodwork checked as you have been told by your prescriber  Talk with your doctor if you are pregnant, planning to become pregnant, or breastfeeding  Discuss the possible need for holding your dose(s) of Stelara?? when a planned procedure is scheduled with the prescriber as it may delay healing/recovery timeline       Drug/Food Interactions     Medication list reviewed in Epic. The patient was instructed to inform the care team before taking any new medications or supplements. No drug interactions identified.   If you have a latex allergy use caution when handling, the needle cap of the Stelara?? prefilled syringe contains a derivative of natural rubber latex  Talk with you prescriber or pharmacist before receiving any live vaccinations while taking this medication and after you stop taking it    Storage, Handling Precautions, & Disposal     Store this medication in the refrigerator.  Do not freeze  If needed, you may store at room temperature for up to 30 days  Store in original packaging, protected from light  Do not shake  Dispose of used syringes/pens in a sharps disposal container          Current Medications (including OTC/herbals), Comorbidities and Allergies     Current Outpatient Medications   Medication Sig Dispense Refill    empty container Misc Use as directed to dispose of Stelara syringe (Patient not taking:  Reported on 12/01/2022) 1 each 3    ustekinumab (STELARA) 90 mg/mL Syrg syringe Inject the contents of 1 syringe (90 mg total) under the skin every 8 weeks. 1 mL 5    ustekinumab (STELARA) 90 mg/mL Syrg syringe Inject the contents of 1 syringe (90 mg total) under the skin every 8 weeks. 1 mL 5     No current facility-administered medications for this visit.       No Known Allergies    There is no problem list on file for this patient.      Medication list has been reviewed and updated in Epic: Yes    Allergies have been reviewed and updated in Epic: Yes    Appropriateness of Therapy     Acute infections noted within Epic:  No active infections  Patient reported infection: None    Is the medication and dose appropriate based on diagnosis, medication list, comorbidities, allergies, medical history, patient???s ability to self-administer the medication, and therapeutic goals? Yes    Prescription has been clinically reviewed: Yes      Baseline Quality of Life Assessment      How many days over the past month did your UC  keep you from your normal activities? For example, brushing your teeth or getting up in the morning. 0    Financial Information     Medication Assistance provided: Prior Authorization and Copay Assistance    Anticipated copay of $0/56 reviewed with patient. Verified delivery address.    Delivery Information     Scheduled delivery date: 10/30/2023    Expected start date: 10/30/2023      Medication will be delivered via UPS to the prescription address in [facility].  This shipment will not require a signature.      Explained the services we provide at [facility] and that each month we would call to set up refills.  Stressed importance of returning phone calls so that we could ensure they receive their medications in time each month.  Informed patient that we should be setting up refills 7-10 days prior to when they will run out of medication.  A pharmacist will reach out to perform a clinical assessment periodically.  Informed patient that a welcome packet, containing information about our pharmacy and other support services, a Notice of Privacy Practices, and a drug information handout will be sent.      The patient or caregiver noted above participated in the development of this care plan and knows that they can request review of or adjustments to the care plan at any time.      Patient or caregiver verbalized understanding of the above information as well as how to contact the pharmacy at [phone] option 4 with any questions/concerns.  The pharmacy is open Monday through Friday 8:30am-4:30pm.  A pharmacist is available 24/7 via pager to answer any clinical questions they may have.    Patient Specific Needs     Does the patient have any physical, cognitive, or cultural barriers? No    Does the patient have adequate living arrangements? (i.e. the ability to store and take their medication appropriately) Yes    Did you identify any home environmental safety or security hazards? No    Patient prefers to have medications discussed with  Patient     Is the patient or caregiver able to read and understand education materials at a high school level or above? Yes    Patient's primary language is  English     Is the patient high risk? No    Does the patient have an additional or emergency contact listed in their chart? Yes    SOCIAL DETERMINANTS OF HEALTH     At the [facility], we have learned that life circumstances - like trouble affording food, housing, utilities, or transportation can affect the health of many of our patients.   That is why we wanted to ask: are you currently experiencing any life circumstances that are negatively impacting your health and/or quality of life? No    Social Drivers of Health     Food Insecurity: Not on file   Tobacco Use: Low Risk  (12/01/2022)    Patient History     Smoking Tobacco Use: Never     Smokeless Tobacco Use: Never     Passive Exposure: Not on file   Transportation Needs: Not on file   Alcohol Use: Not on file   Housing: Not on file   Physical Activity: Not on file   Utilities: Not on file   Stress: Not on file   Interpersonal Safety: Not on file   Substance Use: Not on file (06/04/2023)   Intimate Partner Violence: Not on file   Social Connections: Not on file   Financial Resource Strain: Not on file   Depression: Not at risk (01/08/2022)    Received from [facility] visits prior to 09/29/2022.    PHQ-2     SDOH PHQ2 SCORE: 0   Internet Connectivity: Not on file   Health Literacy: Not on file       Would you be willing to receive help with any of the needs that you have identified today? No       Elnora Morrison, PharmD  [facility] Specialty Pharmacist

## 2023-10-29 MED FILL — STELARA 90 MG/ML SUBCUTANEOUS SYRINGE: SUBCUTANEOUS | 56 days supply | Qty: 1 | Fill #0

## 2023-11-16 ENCOUNTER — Other Ambulatory Visit (HOSPITAL_COMMUNITY): Payer: Self-pay

## 2023-11-16 IMAGING — CT CT ABD-PELV W/ CM
2 of 4 series · 16 of 46 positions shown, 18 images · IV contrast (omnipaque)
Comparison: July 06, 2021.

CLINICAL DATA: Lower abdominal pain, bloody diarrhea.

EXAM:
CT ABDOMEN AND PELVIS WITH CONTRAST
TECHNIQUE: Multidetector CT imaging of the abdomen and pelvis was performed
using the standard protocol following bolus administration of
intravenous contrast.
CONTRAST:  80mL OMNIPAQUE IOHEXOL 300 MG/ML  SOLN

[Series 3: abdomen 5.0 · axial · 0.62mm/px · z∈[+868,+1248]mm · 13 of 88 slices shown, 15 images]
[im 6/88  soft-tissue]
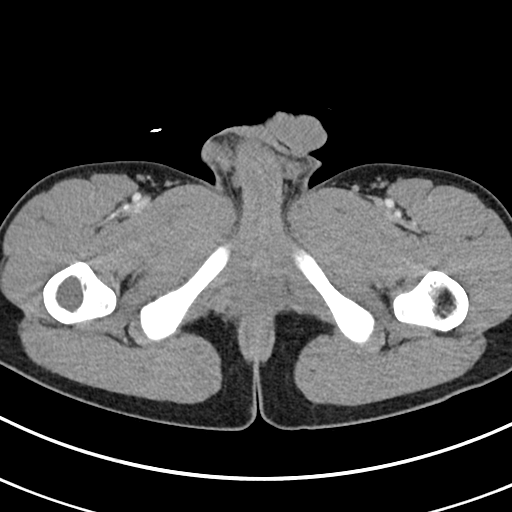
[im 6/88  bone]
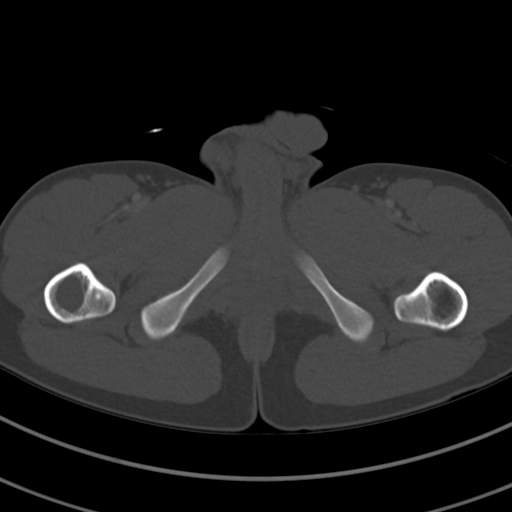
[im 11/88  soft-tissue]
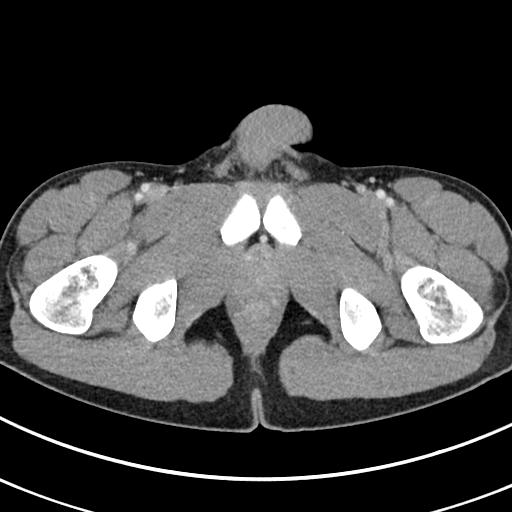
[im 21/88  soft-tissue]
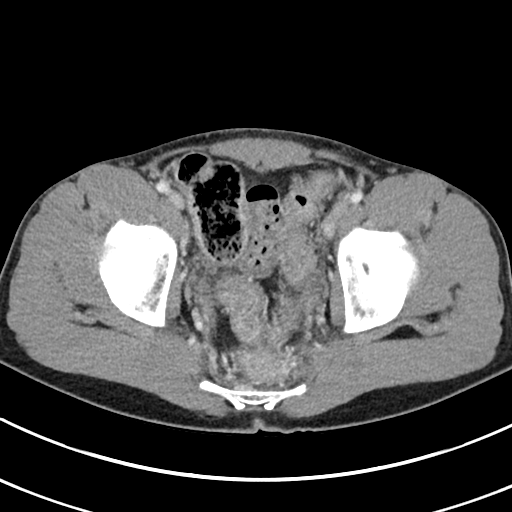
[im 26/88  soft-tissue]
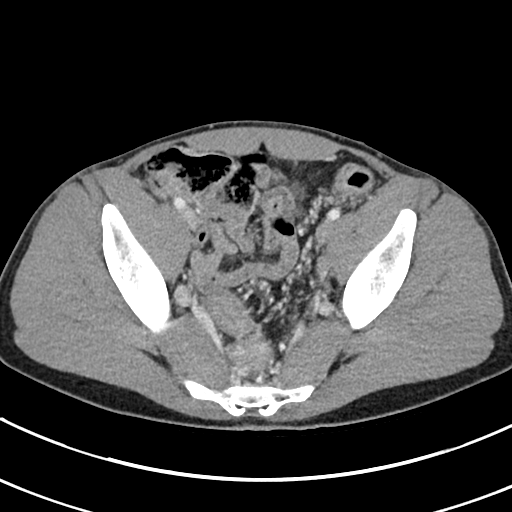
[im 31/88  soft-tissue]
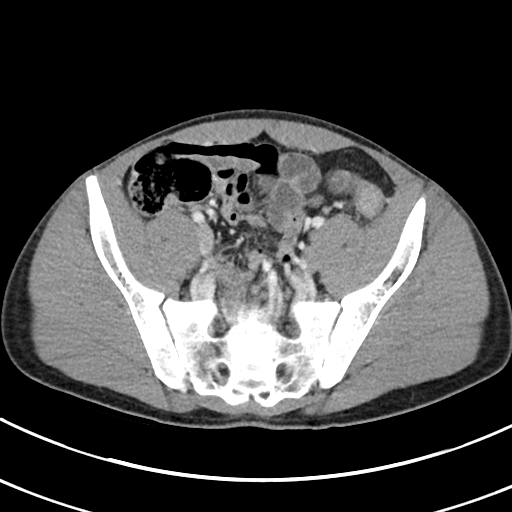
[im 36/88  soft-tissue]
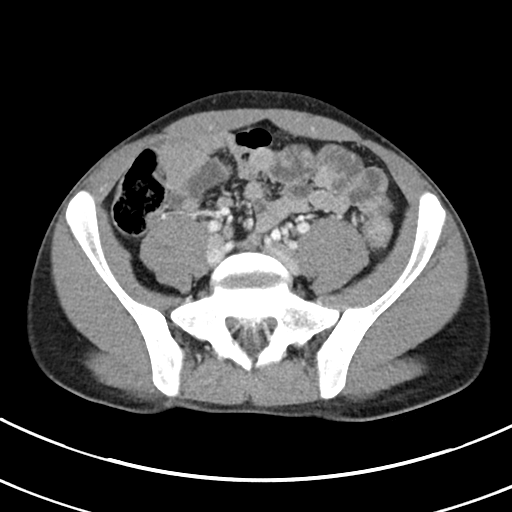
[im 47/88  soft-tissue]
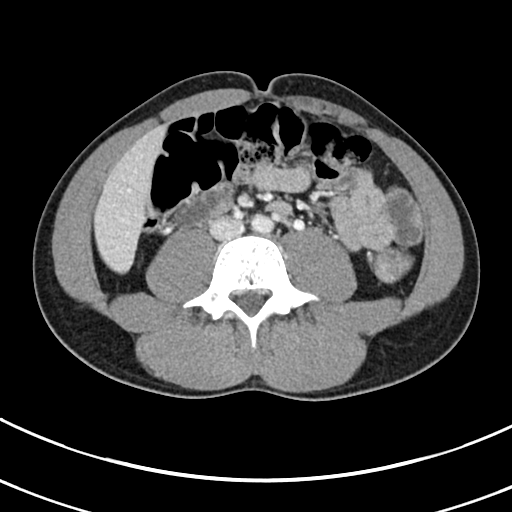
[im 52/88  soft-tissue]
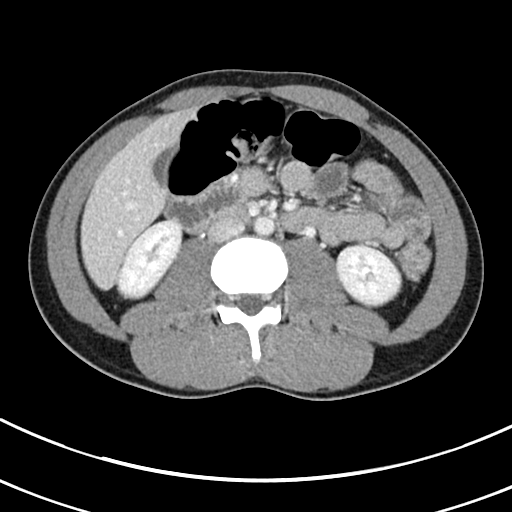
[im 57/88  soft-tissue]
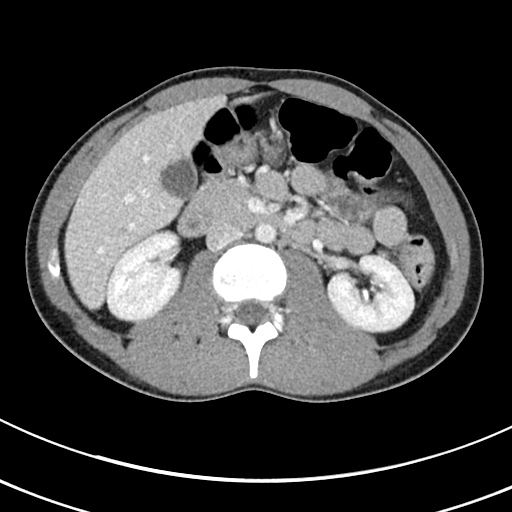
[im 57/88  bone]
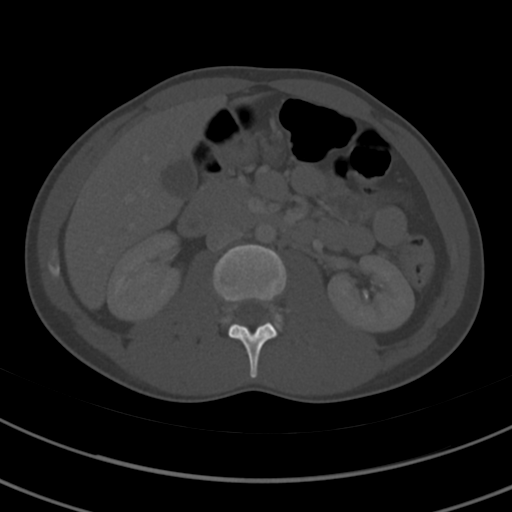
[im 62/88  soft-tissue]
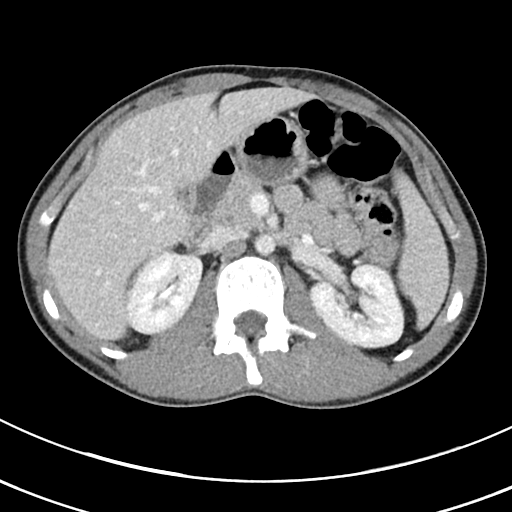
[im 67/88  soft-tissue]
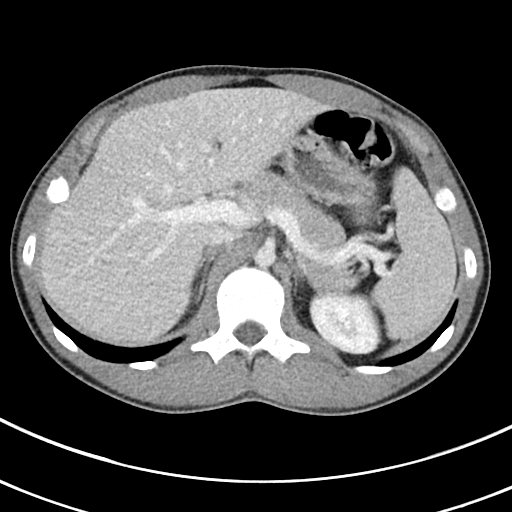
[im 77/88  soft-tissue]
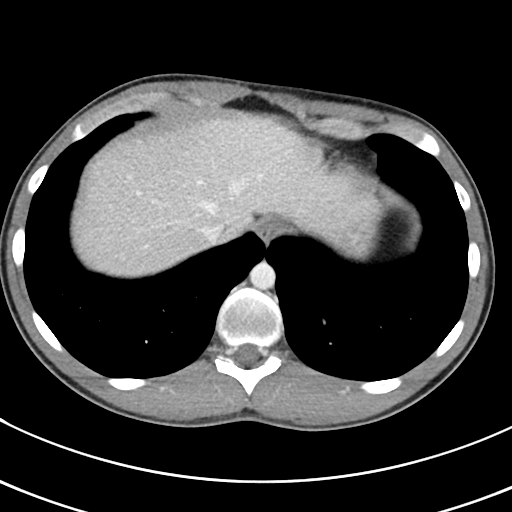
[im 82/88  soft-tissue]
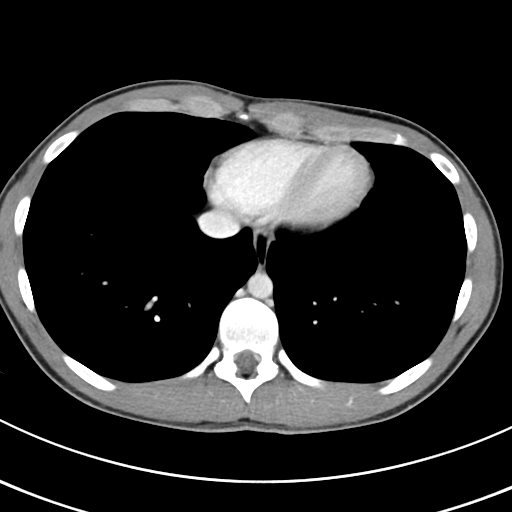

[Series 6: abdomen 3.0 mpr cor · coronal · 0.69mm/px · 3 of 77 slices shown]
[im 26/77  soft-tissue]
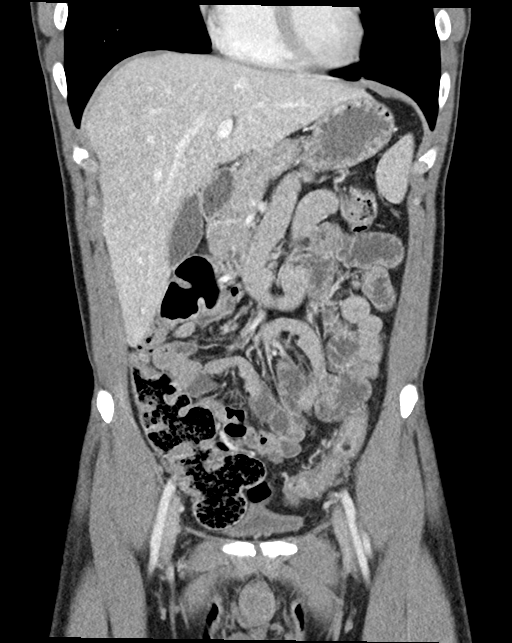
[im 34/77  soft-tissue]
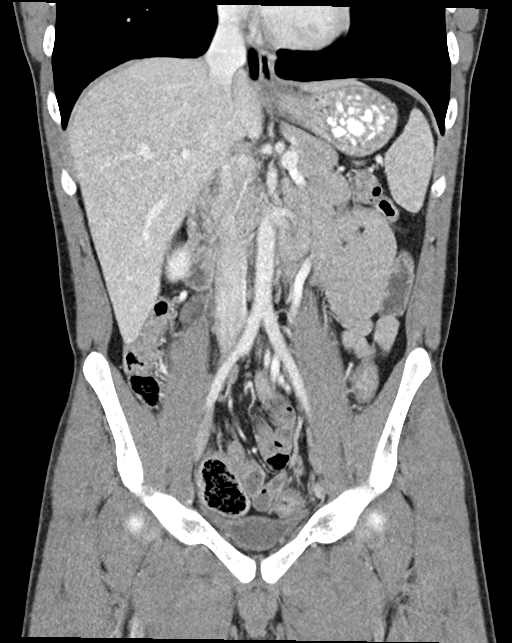
[im 43/77  soft-tissue]
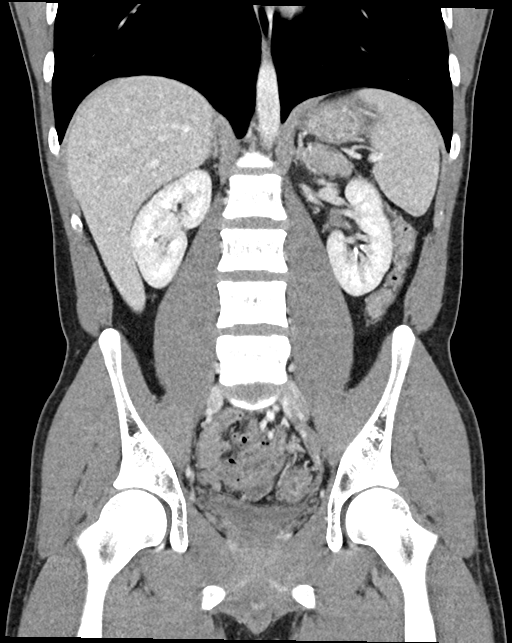

[16 of 46 positions shown; findings below may reference images not displayed]

FINDINGS: Lower chest: No acute abnormality.

Hepatobiliary: No focal liver abnormality is seen. No gallstones,
gallbladder wall thickening, or biliary dilatation.

Pancreas: Unremarkable. No pancreatic ductal dilatation or
surrounding inflammatory changes.

Spleen: Normal in size without focal abnormality.

Adrenals/Urinary Tract: Adrenal glands are unremarkable. Kidneys are
normal, without renal calculi, focal lesion, or hydronephrosis.
Bladder is unremarkable.

Stomach/Bowel: The stomach and appendix are unremarkable. There is
no evidence of bowel obstruction. Wall thickening of descending and
sigmoid colon is noted concerning for infectious or inflammatory
colitis.

Vascular/Lymphatic: No significant vascular findings are present. No
enlarged abdominal or pelvic lymph nodes.

Reproductive: Prostate is unremarkable.

Other: No abdominal wall hernia or abnormality. No abdominopelvic
ascites.

Musculoskeletal: No acute or significant osseous findings.
IMPRESSION: Wall thickening of descending and sigmoid colon consistent with
infectious or inflammatory colitis.

## 2023-12-26 NOTE — Unmapped (Signed)
 12/26/2023 - Patient originally requested delivery for 12/29/2023. Delivery is not possible on this date due to the pharmacy will be closed and the product is refrigerated. I have reached out to the patient and confirmed that delivery on 12/31/2023 is ok.    [facility] Refill Coordination Note    Danny Jones, DOB: 23-Nov-2000  Phone: [phone] (home)       All above HIPAA information was verified with patient.         12/19/2023     9:30 PM   Specialty Rx Medication Refill Questionnaire   Which Medications would you like refilled and shipped? Stelara    Please list all current allergies: None   Have you missed any doses in the last 30 days? No   Have you had any changes to your medication(s) since your last refill? No   How many days remaining of each medication do you have at home? Its an injection taking every 8 weeks   If receiving an injectable medication, next injection date is 12/30/2023   Have you experienced any side effects in the last 30 days? No   Please enter the full address (street address, city, state, zip code) where you would like your medication(s) to be delivered to. [address] [place] [address]   Please specify on which day you would like your medication(s) to arrive. Note: if you need your medication(s) within 3 days, please call the pharmacy to schedule your order at [phone]  12/29/2023   Has your insurance changed since your last refill? No   Would you like a pharmacist to call you to discuss your medication(s)? No   Do you require a signature for your package? (Note: if we are billing Medicare Part B or your order contains a controlled substance, we will require a signature) No   I have been provided my out of pocket cost for my medication and approve the pharmacy to charge the amount to my credit card on file. Yes         Completed refill call assessment today to schedule patient's medication shipment from the [facility] [phone]).  All relevant notes have been reviewed.       Confirmed patient received a Conservation officer, historic buildings and a Surveyor, mining with first shipment. The patient will receive a drug information handout for each medication shipped and additional FDA Medication Guides as required.         REFERRAL TO PHARMACIST     Referral to the pharmacist: Not needed      [facility]     Shipping address confirmed in Epic.     Delivery Scheduled: Yes, Expected medication delivery date: 12/31/2023.     Medication will be delivered via UPS to the prescription address in Epic [facility].    [name]   [facility] Specialty Technician

## 2023-12-30 MED FILL — STELARA 90 MG/ML SUBCUTANEOUS SYRINGE: SUBCUTANEOUS | 56 days supply | Qty: 1 | Fill #1

## 2024-02-18 DIAGNOSIS — K5100 Ulcerative (chronic) pancolitis without complications: Principal | ICD-10-CM

## 2024-03-02 MED ORDER — USTEKINUMAB 90 MG/ML SUBCUTANEOUS SYRINGE
SUBCUTANEOUS | 5 refills | 56.00000 days
Start: 2024-03-02 — End: ?

## 2024-03-06 MED ORDER — YESINTEK 90 MG/ML SUBCUTANEOUS SYRINGE
SUBCUTANEOUS | 5 refills | 0.00000 days
Start: 2024-03-06 — End: ?

## 2024-03-06 NOTE — Unmapped (Signed)
 [facility] Specialty Medication Onboarding    Specialty Medication: [name]   Prior Authorization: Not Required   Financial Assistance: No - copay  <$25  Final Copay/Day Supply: $0 / 56 days    Insurance Restrictions: None     Notes to Pharmacist: Pt was previously on Stelara   Credit Card on File: not applicable  Start Date on Rx:      The triage team has completed the benefits investigation and has determined that the patient is able to fill this medication at [facility]. Please contact the patient to complete the onboarding or follow up with the prescribing physician as needed.

## 2024-03-06 NOTE — Unmapped (Signed)
 Patient switching from Stelara  to [name]  per insurance requirement.    [facility]    Patient Onboarding/Medication Counseling    Danny Jones is a 23 y.o. male with ulcerative colitis who I am counseling today on initiation of therapy.  I am speaking to the patient's family member, mother.    Was a Nurse, learning disability used for this call? No    Verified patient's date of birth / HIPAA.    Specialty medication(s) to be sent: Inflammatory Disorders: [name]       Non-specialty medications/supplies to be sent: na      Medications not needed at this time: na         [name]  (ustekinumab )    Medication & Administration     Dosage: Ulcerative colitis: Inject 90mg  under the skin every 8 weeks starting 8 weeks after IV induction dose    Lab tests required prior to treatment initiation:  Tuberculosis: previously neg quant gold.    Administration:     Artist all supplies needed for injection on a clean, flat working surface: medication syringe(s) removed from packaging, alcohol swab, sharps container, etc.  Look at the medication label - look for correct medication, correct dose, and check the expiration date  Look at the medication - the liquid in the syringe should appear clear and colorless to slightly yellow, you may see a few white particles  Lay the syringe on a flat surface and allow it to warm up to room temperature for at least 15-30 minutes  Select injection site - you can use the front of your thigh or your belly (but not the area 2 inches around your belly button); if someone else is giving you the injection you can also use your upper arm in the skin covering your triceps muscle or in the buttocks  Prepare injection site - wash your hands and clean the skin at the injection site with an alcohol swab and let it air dry, do not touch the injection site again before the injection  Pull off the needle safety cap, do not remove until immediately prior to injection  Pinch the skin - with your hand not holding the syringe pinch up a fold of skin at the injection site using your forefinger and thumb  Insert the needle into the fold of skin at about a 45 degree angle - it's best to use a quick dart-like motion  Push the plunger down slowly as far as it will go until the syringe is empty, if the plunger is not fully depressed the needle shield will not extend to cover the needle when it is removed, hold the syringe in place for a full 5 seconds  Check that the syringe is empty and keep pressing down on the plunger while you pull the needle out at the same angle as inserted; after the needle is removed completely from the skin, release the plunger allowing the needle shield to activate and cover the used needle  Dispose of the used syringe immediately in your sharps disposal container, do not attempt to recap the needle prior to disposing  If you see any blood at the injection site, press a cotton ball or gauze on the site and maintain pressure until the bleeding stops, do not rub the injection site      Adherence/Missed dose instructions:  If your injection is given more than 7-10 days after your scheduled injection date - consult your pharmacist for additional instructions on how to  adjust your dosing schedule.    Goals of Therapy       Ulcerative colitis  Achieve remission of symptoms  Maintain remission of symptoms  Minimize long-term systemic glucocorticoid use  Prevent need for surgical procedures  Maintenance of effective psychosocial functioning      Side Effects & Monitoring Parameters     Injection site reaction (redness, irritation, inflammation localized to the site of administration)  Signs of a common cold - minor sore throat, runny or stuffy nose, etc.  Feeling tired or weak  Headache    The following side effects should be reported to the provider:  Signs of a hypersensitivity reaction - rash; hives; itching; red, swollen, blistered, or peeling skin; wheezing; tightness in the chest or throat; difficulty breathing, swallowing, or talking; swelling of the mouth, face, lips, tongue, or throat; etc.  Reduced immune function - report signs of infection such as fever; chills; body aches; very bad sore throat; ear or sinus pain; cough; more sputum or change in color of sputum; pain with passing urine; wound that will not heal, etc.  Also at a slightly higher risk of some malignancies (mainly skin and blood cancers) due to this reduced immune function.  In the case of signs of infection - the patient should hold the next dose of Stelara ?? and call your primary care provider to ensure adequate medical care.  Treatment may be resumed when infection is treated and patient is asymptomatic.  Changes in skin - a new growth or lump that forms; changes in shape, size, or color of a previous mole or marking  Shortness of breath or chest pain  Vaginal itching or discharge      Contraindications, Warnings, & Precautions     Have your bloodwork checked as you have been told by your prescriber  Talk with your doctor if you are pregnant, planning to become pregnant, or breastfeeding  Discuss the possible need for holding your dose(s) of Stelara ?? when a planned procedure is scheduled with the prescriber as it may delay healing/recovery timeline       Drug/Food Interactions     Medication list reviewed in Epic. The patient was instructed to inform the care team before taking any new medications or supplements. No drug interactions identified.   If you have a latex allergy use caution when handling, the needle cap of the Stelara ?? prefilled syringe contains a derivative of natural rubber latex  Talk with you prescriber or pharmacist before receiving any live vaccinations while taking this medication and after you stop taking it    Storage, Handling Precautions, & Disposal     Store this medication in the refrigerator.  Do not freeze  If needed, you may store at room temperature for up to 30 days  Store in original packaging, protected from light  Do not shake  Dispose of used syringes/pens in a sharps disposal container          Current Medications (including OTC/herbals), Comorbidities and Allergies     Current Medications[1]    Allergies[2]    Problem List[3]    Medication list has been reviewed and updated in Epic: Yes    Allergies have been reviewed and updated in Epic: Yes    Appropriateness of Therapy     Acute infections noted within Epic:  No active infections  Patient reported infection: None    Is the medication and dose appropriate based on diagnosis, medication list, comorbidities, allergies, medical history, patient???s ability to self-administer the medication,  and therapeutic goals? Yes    Prescription has been clinically reviewed: Yes      Baseline Quality of Life Assessment      How many days over the past month did your UC  keep you from your normal activities? For example, brushing your teeth or getting up in the morning. Patient declined to answer    Financial Information     Medication Assistance provided: None Required    Anticipated copay of $0 reviewed with patient. Verified delivery address.    Delivery Information     Scheduled delivery date: 8/12    Expected start date: 8/12      Medication will be delivered via UPS to the prescription address in [facility].  This shipment will not require a signature.      Explained the services we provide at [facility] and that each month we would call to set up refills.  Stressed importance of returning phone calls so that we could ensure they receive their medications in time each month.  Informed patient that we should be setting up refills 7-10 days prior to when they will run out of medication.  A pharmacist will reach out to perform a clinical assessment periodically.  Informed patient that a welcome packet, containing information about our pharmacy and other support services, a Notice of Privacy Practices, and a drug information handout will be sent.      The patient or caregiver noted above participated in the development of this care plan and knows that they can request review of or adjustments to the care plan at any time.      Patient or caregiver verbalized understanding of the above information as well as how to contact the pharmacy at [phone] option 4 with any questions/concerns.  The pharmacy is open Monday through Friday 8:30am-4:30pm.  A pharmacist is available 24/7 via pager to answer any clinical questions they may have.    Patient Specific Needs     Does the patient have any physical, cognitive, or cultural barriers? No    Does the patient have adequate living arrangements? (i.e. the ability to store and take their medication appropriately) Yes    Did you identify any home environmental safety or security hazards? No    Patient prefers to have medications discussed with  Patient     Is the patient or caregiver able to read and understand education materials at a high school level or above? Yes    Patient's primary language is  English     Is the patient high risk? No    Does the patient have an additional or emergency contact listed in their chart? Yes    SOCIAL DETERMINANTS OF HEALTH     At the [facility], we have learned that life circumstances - like trouble affording food, housing, utilities, or transportation can affect the health of many of our patients.   That is why we wanted to ask: are you currently experiencing any life circumstances that are negatively impacting your health and/or quality of life? Patient declined to answer    Social Drivers of Health     Food Insecurity: Patient Declined (08/17/2023)    Received from [facility]    Hunger Vital Sign     Within the past 12 months, you worried that your food would run out before you got the money to buy more.: Patient declined     Within the past 12 months, the food you bought  just didn't last and you didn't have money to get more.: Patient declined Tobacco Use: High Risk (11/27/2023)    Received from [facility]    Patient History     Smoking Tobacco Use: Every Day     Smokeless Tobacco Use: Current     Passive Exposure: Not on file   Transportation Needs: Patient Declined (08/17/2023)    Received from [facility] - Transportation     Lack of Transportation (Medical): Patient declined     Lack of Transportation (Non-Medical): Patient declined   Alcohol Use: Not on file   Housing: Not on file   Physical Activity: Not on file   Utilities: Patient Declined (08/17/2023)    Received from [facility]     Threatened with loss of utilities: Patient declined   Stress: Not on file   Interpersonal Safety: Not on file   Substance Use: Not on file (06/04/2023)   Intimate Partner Violence: Patient Declined (08/17/2023)    Received from [facility]    Humiliation, Afraid, Rape, and Kick questionnaire     Within the last year, have you been afraid of your partner or ex-partner?: Patient declined     Within the last year, have you been humiliated or emotionally abused in other ways by your partner or ex-partner?: Patient declined     Within the last year, have you been kicked, hit, slapped, or otherwise physically hurt by your partner or ex-partner?: Patient declined     Within the last year, have you been raped or forced to have any kind of sexual activity by your partner or ex-partner?: Patient declined   Social Connections: Patient Declined (08/17/2023)    Received from [facility]    Social Connection and Isolation Panel     In a typical week, how many times do you talk on the phone with family, friends, or neighbors?: Patient declined     How often do you get together with friends or relatives?: Patient declined     How often do you attend church or religious services?: Patient declined     Do you belong to any clubs or organizations such as church groups, unions, fraternal or athletic groups, or school groups?: Patient declined     How often do you attend meetings of the clubs or organizations you belong to?: Patient declined     Are you married, widowed, divorced, separated, never married, or living with a partner?: Patient declined   Physicist, medical Strain: Not on file   Health Literacy: Not on file   Internet Connectivity: Not on file       Would you be willing to receive help with any of the needs that you have identified today? Not applicable       Nancyann Cotterman A Claud[facility] Specialty Pharmacist         [1]   Current Outpatient Medications   Medication Sig Dispense Refill    empty container Misc Use as directed to dispose of Stelara  syringe (Patient not taking: Reported on 12/01/2022) 1 each 3    ustekinumab  (STELARA ) 90 mg/mL Syrg syringe Inject the contents of 1 syringe (90 mg total) under the skin every 8 weeks. 1 mL 5    ustekinumab -kfce ([name] ) 90 mg/mL Syrg Inject the contents of 1 syringe (90 mg) under the skin every 8 weeks. 1 mL 5     No current facility-administered medications for this visit.   [  2] No Known Allergies  [3] There is no problem list on file for this patient.

## 2024-03-09 MED FILL — YESINTEK 90 MG/ML SUBCUTANEOUS SYRINGE: SUBCUTANEOUS | 56 days supply | Qty: 1 | Fill #0

## 2024-05-11 MED FILL — YESINTEK 90 MG/ML SUBCUTANEOUS SYRINGE: SUBCUTANEOUS | 56 days supply | Qty: 1 | Fill #1

## 2024-05-11 NOTE — Unmapped (Signed)
 05/11/2024 - Clinical assessment date was due on 04/27/2024. Reached out to [facility]  and got approval to proceed with scheduling the patients refill and to move the clinical assessment date to match the next refill coordination.

## 2024-05-11 NOTE — Unmapped (Signed)
 [facility] Refill Coordination Note    Specialty Medication(s) to be Shipped:   Inflammatory Disorders: yesintek     Other medication(s) to be shipped: No additional medications requested for fill at this time    Specialty Medications not needed at this time: N/A     Danny Jones, DOB: 2001/06/29  Phone: [phone] (home)       All above HIPAA information was verified with patient.     Was a Nurse, learning disability used for this call? No    Completed refill call assessment today to schedule patient's medication shipment from the [facility]  [phone]).  All relevant notes have been reviewed.     Specialty medication(s) and dose(s) confirmed: Regimen is correct and unchanged.   Changes to medications: Demarlo reports no changes at this time.  Changes to insurance: No  New side effects reported not previously addressed with a pharmacist or physician: None reported  Questions for the pharmacist: No    Confirmed patient received a Conservation officer, historic buildings and a Surveyor, mining with first shipment. The patient will receive a drug information handout for each medication shipped and additional FDA Medication Guides as required.       DISEASE/MEDICATION-SPECIFIC INFORMATION        For patients on injectable medications: Next injection is scheduled for 05/04/2024.    SPECIALTY MEDICATION ADHERENCE     Medication Adherence    Patient reported X missed doses in the last month: 0  Specialty Medication: [name]  90 mg/mL Syrg (ustekinumab -kfce)  Patient is on additional specialty medications: No              Were doses missed due to medication being on hold? No    [name]  90 mg/mL Syrg (ustekinumab -kfce)  0 doses of medicine on hand       REFERRAL TO PHARMACIST     Referral to the pharmacist: Not needed      [facility]     Shipping address confirmed in Epic.     Cost and Payment: Patient has a $0 copay, payment information is not required.    Delivery Scheduled: Yes, Expected medication delivery date: 05/12/2024.     Medication will be delivered via UPS to the prescription address in Epic [facility].    Nelida Winfred [facility]  Specialty Technician

## 2024-06-29 NOTE — Progress Notes (Signed)
 06/29/2024 - Clinical assessment date was due on 12/01. Reached out to [facility] and got approval to proceed with scheduling the patients refill and to move the clinical assessment date to match the next refill coordination.

## 2024-06-29 NOTE — Progress Notes (Signed)
 [facility] Refill Coordination Note    Danny Jones, DOB: 02-May-2001  Phone: There are no phone numbers on file.      All above HIPAA information was verified with patient.         06/25/2024    12:11 PM   Specialty Rx Medication Refill Questionnaire   Which Medications would you like refilled and shipped? [name]    Please list all current allergies: None   Have you missed any doses in the last 30 days? No   Have you had any changes to your medication(s) since your last refill? No   How much of each medication do you have remaining at home? (eg. number of tablets, injections, etc.) None   If receiving an injectable medication, next injection date is 07/01/2024   Have you experienced any side effects in the last 30 days? No   Please enter the full address (street address, city, state, zip code) where you would like your medication(s) to be delivered to. [address] [place] [address]   Please specify on which day you would like your medication(s) to arrive. Note: if you need your medication(s) within 3 days, please call the pharmacy to schedule your order at [phone]  07/01/2024   Has your insurance changed since your last refill? No   Would you like a pharmacist to call you to discuss your medication(s)? No   Do you require a signature for your package? (Note: if we are billing Medicare Part B or your order contains a controlled substance, we will require a signature) No   I have been provided my out of pocket cost for my medication and approve the pharmacy to charge the amount to my credit card on file. Yes         Completed refill call assessment today to schedule patient's medication shipment from the [facility] [phone]).  All relevant notes have been reviewed.       Confirmed patient received a Conservation Officer, Historic Buildings and a Surveyor, Mining with first shipment. The patient will receive a drug information handout for each medication shipped and additional FDA Medication Guides as required.         REFERRAL TO PHARMACIST     Referral to the pharmacist: Not needed      [facility]     Shipping address confirmed in Epic.     Delivery Scheduled: Yes, Expected medication delivery date: 12/03.     Medication will be delivered via UPS to the prescription address in Epic W[facility].    Camelia Jones [facility] Specialty Technician

## 2024-06-30 MED FILL — YESINTEK 90 MG/ML SUBCUTANEOUS SYRINGE: SUBCUTANEOUS | 56 days supply | Qty: 1 | Fill #2

## 2024-08-18 DIAGNOSIS — K5100 Ulcerative (chronic) pancolitis without complications: Principal | ICD-10-CM
